# Patient Record
Sex: Male | Born: 1945 | Race: White | Hispanic: No | Marital: Married | Smoking: Never smoker
Health system: Southern US, Community
[De-identification: ages and names within clinical notes are randomized; demographics above are authoritative.]

## PROBLEM LIST (undated history)

## (undated) DIAGNOSIS — K274 Chronic or unspecified peptic ulcer, site unspecified, with hemorrhage: Secondary | ICD-10-CM

## (undated) DIAGNOSIS — K284 Chronic or unspecified gastrojejunal ulcer with hemorrhage: Secondary | ICD-10-CM

## (undated) HISTORY — PX: ESOPHAGOGASTRODUODENOSCOPY: SHX1529

## (undated) HISTORY — DX: Chronic or unspecified peptic ulcer, site unspecified, with hemorrhage: K27.4

## (undated) HISTORY — DX: Chronic or unspecified gastrojejunal ulcer with hemorrhage: K28.4

## (undated) HISTORY — PX: COLONOSCOPY: SHX174

---

## 2005-09-25 ENCOUNTER — Ambulatory Visit: Payer: Self-pay | Admitting: Family Medicine

## 2005-10-02 ENCOUNTER — Ambulatory Visit: Payer: Self-pay | Admitting: Family Medicine

## 2005-12-28 ENCOUNTER — Ambulatory Visit: Payer: Self-pay | Admitting: Family Medicine

## 2005-12-28 ENCOUNTER — Encounter: Admission: RE | Admit: 2005-12-28 | Discharge: 2005-12-28 | Payer: Self-pay | Admitting: Family Medicine

## 2006-01-08 ENCOUNTER — Ambulatory Visit: Payer: Self-pay | Admitting: Family Medicine

## 2006-08-26 ENCOUNTER — Ambulatory Visit: Payer: Self-pay | Admitting: Family Medicine

## 2006-09-09 ENCOUNTER — Ambulatory Visit: Payer: Self-pay | Admitting: Family Medicine

## 2006-10-07 ENCOUNTER — Ambulatory Visit: Payer: Self-pay | Admitting: Family Medicine

## 2006-10-07 LAB — CONVERTED CEMR LAB
Alkaline Phosphatase: 82 units/L (ref 39–117)
BUN: 8 mg/dL (ref 6–23)
Basophils Absolute: 0.1 10*3/uL (ref 0.0–0.1)
Basophils Relative: 0.8 % (ref 0.0–1.0)
Bilirubin, Direct: 0.1 mg/dL (ref 0.0–0.3)
Calcium: 8.8 mg/dL (ref 8.4–10.5)
Cholesterol: 178 mg/dL (ref 0–200)
Eosinophils Absolute: 0.1 10*3/uL (ref 0.0–0.6)
HCT: 47 % (ref 39.0–52.0)
HDL: 37.2 mg/dL — ABNORMAL LOW (ref >39.0)
Hemoglobin: 16.3 g/dL (ref 13.0–17.0)
Lymphocytes Relative: 24.7 % (ref 12.0–46.0)
MCHC: 34.7 g/dL (ref 30.0–36.0)
Neutrophils Relative %: 63 % (ref 43.0–77.0)
RBC: 5.06 M/uL (ref 4.22–5.81)
RDW: 12.3 % (ref 11.5–14.6)
Total CHOL/HDL Ratio: 4.8
Triglycerides: 171 mg/dL — ABNORMAL HIGH (ref 0–149)
WBC: 7.2 10*3/uL (ref 4.5–10.5)

## 2006-10-15 ENCOUNTER — Ambulatory Visit: Payer: Self-pay | Admitting: Family Medicine

## 2008-11-09 ENCOUNTER — Ambulatory Visit: Payer: Self-pay | Admitting: Family Medicine

## 2008-11-09 LAB — CONVERTED CEMR LAB
Bilirubin Urine: NEGATIVE
Ketones, urine, test strip: NEGATIVE
Nitrite: NEGATIVE
Specific Gravity, Urine: 1.02
WBC Urine, dipstick: NEGATIVE

## 2008-11-16 ENCOUNTER — Ambulatory Visit: Payer: Self-pay | Admitting: Family Medicine

## 2008-11-16 DIAGNOSIS — M129 Arthropathy, unspecified: Secondary | ICD-10-CM | POA: Insufficient documentation

## 2008-11-16 DIAGNOSIS — J309 Allergic rhinitis, unspecified: Secondary | ICD-10-CM | POA: Insufficient documentation

## 2008-11-16 DIAGNOSIS — B351 Tinea unguium: Secondary | ICD-10-CM

## 2008-11-16 LAB — HM COLONOSCOPY

## 2008-11-23 LAB — CONVERTED CEMR LAB
BUN: 11 mg/dL (ref 6–23)
CO2: 31 meq/L (ref 19–32)
Calcium: 9 mg/dL (ref 8.4–10.5)
Chloride: 109 meq/L (ref 96–112)
Creatinine, Ser: 0.8 mg/dL (ref 0.4–1.5)
Eosinophils Absolute: 0.1 10*3/uL (ref 0.0–0.7)
Eosinophils Relative: 1.5 % (ref 0.0–5.0)
GFR calc non Af Amer: 103.19 mL/min (ref >60)
Glucose, Bld: 121 mg/dL — ABNORMAL HIGH (ref 70–99)
HDL: 33.9 mg/dL — ABNORMAL LOW (ref >39.00)
Hemoglobin: 17.6 g/dL — ABNORMAL HIGH (ref 13.0–17.0)
LDL Cholesterol: 130 mg/dL — ABNORMAL HIGH (ref 0–99)
Lymphs Abs: 1.5 10*3/uL (ref 0.7–4.0)
MCV: 93.5 fL (ref 78.0–100.0)
Monocytes Relative: 7.5 % (ref 3.0–12.0)
Potassium: 4.3 meq/L (ref 3.5–5.1)
RBC: 5.3 M/uL (ref 4.22–5.81)
RDW: 12.5 % (ref 11.5–14.6)
Sodium: 146 meq/L — ABNORMAL HIGH (ref 135–145)
TSH: 2.15 microintl units/mL (ref 0.35–5.50)
Total Bilirubin: 1 mg/dL (ref 0.3–1.2)
Total CHOL/HDL Ratio: 6

## 2008-12-15 ENCOUNTER — Ambulatory Visit: Payer: Self-pay | Admitting: Family Medicine

## 2010-01-24 ENCOUNTER — Ambulatory Visit: Payer: Self-pay | Admitting: Family Medicine

## 2010-01-24 DIAGNOSIS — T50995A Adverse effect of other drugs, medicaments and biological substances, initial encounter: Secondary | ICD-10-CM

## 2010-01-24 DIAGNOSIS — E559 Vitamin D deficiency, unspecified: Secondary | ICD-10-CM | POA: Insufficient documentation

## 2010-01-24 DIAGNOSIS — E785 Hyperlipidemia, unspecified: Secondary | ICD-10-CM | POA: Insufficient documentation

## 2010-01-24 DIAGNOSIS — E039 Hypothyroidism, unspecified: Secondary | ICD-10-CM | POA: Insufficient documentation

## 2010-01-24 DIAGNOSIS — R350 Frequency of micturition: Secondary | ICD-10-CM

## 2010-01-24 DIAGNOSIS — D649 Anemia, unspecified: Secondary | ICD-10-CM | POA: Insufficient documentation

## 2010-07-29 LAB — CONVERTED CEMR LAB
ALT: 19 units/L (ref 0–53)
AST: 22 units/L (ref 0–37)
Albumin: 4.4 g/dL (ref 3.5–5.2)
Alkaline Phosphatase: 79 units/L (ref 39–117)
BUN: 12 mg/dL (ref 6–23)
Bilirubin, Direct: 0.2 mg/dL (ref 0.0–0.3)
CO2: 30 meq/L (ref 19–32)
Calcium: 9.1 mg/dL (ref 8.4–10.5)
Chloride: 102 meq/L (ref 96–112)
Cholesterol: 173 mg/dL (ref 0–200)
Creatinine, Ser: 0.8 mg/dL (ref 0.4–1.5)
Eosinophils Absolute: 0.1 10*3/uL (ref 0.0–0.7)
Eosinophils Relative: 1.1 % (ref 0.0–5.0)
GFR calc non Af Amer: 99.92 mL/min (ref >60)
Glucose, Bld: 103 mg/dL — ABNORMAL HIGH (ref 70–99)
HCT: 47.1 % (ref 39.0–52.0)
LDL Cholesterol: 109 mg/dL — ABNORMAL HIGH (ref 0–99)
Lymphocytes Relative: 18.3 % (ref 12.0–46.0)
Lymphs Abs: 1.7 10*3/uL (ref 0.7–4.0)
MCV: 94.8 fL (ref 78.0–100.0)
Nitrite: NEGATIVE
Platelets: 213 10*3/uL (ref 150.0–400.0)
Potassium: 5.2 meq/L — ABNORMAL HIGH (ref 3.5–5.1)
RBC: 4.97 M/uL (ref 4.22–5.81)
Total CHOL/HDL Ratio: 5
Total Protein: 7 g/dL (ref 6.0–8.3)
Triglycerides: 132 mg/dL (ref 0.0–149.0)
Vit D, 25-Hydroxy: 32 ng/mL (ref 30–89)
WBC: 9.5 10*3/uL (ref 4.5–10.5)
pH: 7

## 2010-08-02 NOTE — Assessment & Plan Note (Signed)
Summary: pt will come in fasting/njr   Vital Signs:  Patient profile:   65 year old male Height:      69 inches Weight:      168 pounds BMI:     24.90 O2 Sat:      97 % Temp:     97.8 degrees F Pulse rate:   102 / minute Pulse rhythm:   regular BP sitting:   130 / 82  (left arm)  Vitals Entered By: Pura Spice, RN (January 24, 2010 10:43 AM) CC: go over problems fasting for labs Is Patient Diabetic? 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 Jonne Ply  Past History:  Past Medical History: Silent Bleeding Ulcer in 1980s no  ASA or Nsaids  Review of Systems      See HPI  The patient denies anorexia, fever, weight loss, weight gain, vision loss, decreased hearing, hoarseness, chest pain, syncope, dyspnea on exertion, peripheral edema, prolonged cough, headaches, hemoptysis, abdominal pain, melena, hematochezia, severe indigestion/heartburn, hematuria, incontinence, genital sores, muscle weakness, suspicious skin lesions, transient blindness, difficulty walking, depression, unusual weight change, abnormal bleeding, enlarged lymph nodes, angioedema, breast masses, and testicular masses.    Physical Exam  General:  Well-developed,well-nourished,in no acute distress; alert,appropriate and cooperative throughout examinationballd  Head:  Normocephalic and atraumatic without obvious abnormalities. No apparent alopecia or balding. Eyes:  No corneal or conjunctival inflammation noted. EOMI.  Perrla. Funduscopic exam benign, without hemorrhages, exudates or papilledema. Vision grossly normal. Ears:  External ear exam shows no significant lesions or deformities.  Otoscopic examination reveals clear canals, tympanic membranes are intact bilaterally without bulging, retraction, inflammation or discharge. Hearing is grossly normal bilaterally. Nose:  External nasal examination shows no deformity or inflammation. Nasal mucosa are pink and moist without lesions or exudates. Mouth:  Oral mucosa and oropharynx without lesions or exudates.  Teeth in good repair. Neck:  No deformities, masses, or tenderness noted. Chest Wall:  No deformities, masses, tenderness or gynecomastia noted. Breasts:  No masses or gynecomastia noted Lungs:  Normal respiratory effort, chest expands symmetrically. Lungs are clear to auscultation, no crackles or wheezes. Heart:  Normal rate and regular rhythm. S1 and S2 normal without gallop, murmur, click, rub or other extra sounds. cardiogram revealed no ischemia nor abnormal rhythm Abdomen:  Bowel sounds positive,abdomen soft and non-tender without masses, organomegaly or hernias noted. Rectal:  No external abnormalities noted. Normal sphincter tone. No rectal masses or tenderness. Genitalia:  Testes bilaterally descended without nodularity, tenderness or masses. No scrotal masses or lesions. No penis lesions or urethral discharge. Prostate:  Prostate gland firm and smooth, no enlargement, nodularity, tenderness, mass, asymmetry or induration. Msk:  No deformity or scoliosis noted of thoracic or lumbar spine.   Pulses:  R and L carotid,radial,femoral,dorsalis pedis and posterior tibial pulses are full and equal bilaterally Extremities:  No clubbing, cyanosis, edema, or deformity noted with normal full range of motion of all joints.   Neurologic:  No cranial nerve  deficits noted. Station and gait are normal. Plantar reflexes are down-going bilaterally. DTRs are symmetrical  throughout. Sensory, motor and coordinative functions appear intact. Skin:  Intact without suspicious lesions or rashes Cervical Nodes:  No lymphadenopathy noted Axillary Nodes:  No palpable lymphadenopathy Inguinal Nodes:  No significant adenopathy Psych:  Cognition and judgment appear intact. Alert and cooperative with normal attention span and concentration. No apparent delusions, illusions, hallucinations   Impression & Recommendations:  Problem # 1:  PHYSICAL EXAMINATION (ICD-V70.0) Assessment Unchanged first severe physical for Medicare  Problem # 2:  ONYCHOMYCOSIS, TOENAILS (ICD-110.1) Assessment: Unchanged  The following medications were removed from the medication list:    Lamisil 250 Mg Tabs (Terbinafine hcl) .Marland Kitchen... 1 once daily for 3 months  Complete Medication List: 1)  Timolol Maleate 0.25 % Soln (Timolol maleate) .... Pt thinks this is it 2)  Travatan 0.004 % Soln (Travoprost) .... Sees dr thurmond 3)  Zovirax 5 % Crea (Acyclovir) .... Apply qid as needed  Other Orders: Venipuncture (40981) T-Vitamin D (25-Hydroxy) [phone]) Urinalysis-dipstick only (Medicare patient) (21308MV) EKG w/ Interpretation (93000) Specimen Handling (78469) TLB-Lipid Panel (80061-LIPID) TLB-BMP (Basic Metabolic Panel-BMET) (80048-METABOL) TLB-CBC Platelet - w/Differential (85025-CBCD) TLB-Hepatic/Liver Function Pnl (80076-HEPATIC) TLB-TSH (Thyroid Stimulating Hormone) (84443-TSH) TLB-PSA (Prostate Specific Antigen) (84153-PSA)  Patient Instructions: 1)  physical examination reveals a healthy male who is lost in balance since his physical one year ago 2)  I do not find any new problems or abnormality 3)  Continue regular walking as you've been doing in the past 4)  We'll call lab results Prescriptions: ZOVIRAX 5 % CREA (ACYCLOVIR) apply qid as needed  #5gms x 11   Entered and Authorized by:   Judithann Sheen MD   Signed by:   Judithann Sheen MD on 01/24/2010   Method  used:   Electronically to        [facility] ([facility]       [address]       [place], [place]  [address]       Ph: [phone] or [phone]       Fax: [phone]   RxID:   [id number]    Eye Exam  Dr Santiago Bumpers 2011     Laboratory Results   Urine Tests  Date/Time Recieved: January 24, 2010 12:30 PM  Date/Time Reported: January 24, 2010 12:30 PM   Routine Urinalysis   Color: yellow Appearance: Clear Glucose: negative   (Normal Range: Negative) Bilirubin: negative   (Normal Range: Negative) Ketone: negative   (Normal Range: Negative) Spec. Gravity: 1.025   (Normal Range: 1.003-1.035) Blood: negative   (Normal Range: Negative) pH: 7.0   (Normal Range: 5.0-8.0) Protein: trace   (Normal Range: Negative) Urobilinogen: 0.2   (Normal Range: 0-1) Nitrite: negative   (Normal Range: Negative) Leukocyte Esterace: negative   (Normal Range: Negative)    Comments: Wynona Canes, CMA  January 24, 2010 12:30 PM

## 2011-02-14 ENCOUNTER — Ambulatory Visit (INDEPENDENT_AMBULATORY_CARE_PROVIDER_SITE_OTHER): Payer: Medicare Other | Admitting: Family Medicine

## 2011-02-14 ENCOUNTER — Encounter: Payer: Self-pay | Admitting: Family Medicine

## 2011-02-14 VITALS — BP 110/78 | HR 100 | Temp 98.0°F | Resp 16 | Ht 69.5 in | Wt 163.0 lb

## 2011-02-14 DIAGNOSIS — R351 Nocturia: Secondary | ICD-10-CM

## 2011-02-14 DIAGNOSIS — E559 Vitamin D deficiency, unspecified: Secondary | ICD-10-CM

## 2011-02-14 DIAGNOSIS — N139 Obstructive and reflux uropathy, unspecified: Secondary | ICD-10-CM

## 2011-02-14 DIAGNOSIS — M1990 Unspecified osteoarthritis, unspecified site: Secondary | ICD-10-CM

## 2011-02-14 DIAGNOSIS — N401 Enlarged prostate with lower urinary tract symptoms: Secondary | ICD-10-CM

## 2011-02-14 DIAGNOSIS — Z136 Encounter for screening for cardiovascular disorders: Secondary | ICD-10-CM

## 2011-02-14 DIAGNOSIS — E039 Hypothyroidism, unspecified: Secondary | ICD-10-CM

## 2011-02-14 DIAGNOSIS — E785 Hyperlipidemia, unspecified: Secondary | ICD-10-CM

## 2011-02-14 DIAGNOSIS — H409 Unspecified glaucoma: Secondary | ICD-10-CM

## 2011-02-14 DIAGNOSIS — D649 Anemia, unspecified: Secondary | ICD-10-CM

## 2011-02-14 DIAGNOSIS — M129 Arthropathy, unspecified: Secondary | ICD-10-CM

## 2011-02-14 LAB — HEPATIC FUNCTION PANEL
ALT: 19 U/L (ref 0–53)
Total Protein: 6.9 g/dL (ref 6.0–8.3)

## 2011-02-14 LAB — CBC WITH DIFFERENTIAL/PLATELET
Basophils Absolute: 0 10*3/uL (ref 0.0–0.1)
Eosinophils Absolute: 0.1 10*3/uL (ref 0.0–0.7)
Hemoglobin: 16.9 g/dL (ref 13.0–17.0)
Lymphocytes Relative: 18.5 % (ref 12.0–46.0)
MCHC: 33.9 g/dL (ref 30.0–36.0)
MCV: 96.3 fl (ref 78.0–100.0)
Monocytes Absolute: 0.7 10*3/uL (ref 0.1–1.0)
Neutro Abs: 5.7 10*3/uL (ref 1.4–7.7)
Neutrophils Relative %: 71.4 % (ref 43.0–77.0)
RBC: 5.19 Mil/uL (ref 4.22–5.81)
RDW: 13.4 % (ref 11.5–14.6)
WBC: 8 10*3/uL (ref 4.5–10.5)

## 2011-02-14 LAB — POCT URINALYSIS DIPSTICK
Ketones, UA: NEGATIVE
Leukocytes, UA: NEGATIVE
Nitrite, UA: NEGATIVE

## 2011-02-14 LAB — LIPID PANEL
Cholesterol: 173 mg/dL (ref 0–200)
HDL: 45.1 mg/dL (ref >39.00)
Total CHOL/HDL Ratio: 4
Triglycerides: 118 mg/dL (ref 0.0–149.0)

## 2011-02-14 LAB — BASIC METABOLIC PANEL
Chloride: 99 mEq/L (ref 96–112)
Sodium: 137 mEq/L (ref 135–145)

## 2011-02-14 MED ORDER — PREDNISONE 10 MG PO TABS: ORAL_TABLET | ORAL | Status: DC

## 2011-02-14 NOTE — Patient Instructions (Signed)
You are doing very well,  EKG only abnormality is 2 ectopic beats Arthritis toes rt foot, take short course prednisone, if goes away but then reoccurance repeat tratment

## 2011-02-14 NOTE — Progress Notes (Signed)
  Subjective:    Patient ID: Derrick Harris, male    DOB: [birth year], 65 y.o.   MRN: [id number] This 65 year old white married male is in to discuss his medical which are few but has slowed to of pain in the right foot or toes which is rather severe time but of shorter duration. He also relates and occasionally has a unexpected epistaxis . Under care of ophthalmologist for glaucoma. System review reveals no new symptoms. Has past history years ago of asymptomatic GI bleeding and has been recommended not to take aspirin or NSAIDs He relates he has urinary frequency during the day not. 2-,3 times a night no dysuria no change in flow HPI    Review of Systems  Constitutional: Negative.   HENT: Positive for nosebleeds.   Eyes:       Glaucoma control  Respiratory: Negative.   Cardiovascular: Negative.   Gastrointestinal: Negative.   Genitourinary:       Increased urinary frequency 2-3 hours during the day nocturia 2-3 times a night  Musculoskeletal: Negative.   Neurological: Positive for facial asymmetry.  Hematological: Bruises/bleeds easily.       Objective:   Physical Exam the patient is a well-built well-nourished white male who appears healthy and in no distress  HEENT no positive findings carotid pulses good thyroid is normal Lungs are clear to palpation percussion and auscultation no rales no dullness no wheezing Heart examination reveals no cardiomegaly heart sounds are good without murmurs regular rhythm peripheral pulses are good and equal bilaterally EKG normal except for 2 ectopic beats Abdomen liver spleen kidneys are nonpalpable no masses no tenderness normal bowel sounds Genitalia normal Rectal exam normal prostate flat no tenderness no nodules normal size Extremities all the positive findings consist of tenderness MP joints of the second third fourth toes right foot Skin small area of eczema left side resolving Spine negative exam Neurological no positive findings reflexes  normal equal bilaterally 2+        Assessment & Plan:  Healthy male blood pressure  Normal Arthritis toes right foot treat with short course prednisone 10 mg Unexplained epistaxis if persists or referred to ENT Colonoscopic exam due in 2014

## 2011-02-18 NOTE — Progress Notes (Signed)
Quick Note:  Pt aware ______ 

## 2011-07-09 DIAGNOSIS — H409 Unspecified glaucoma: Secondary | ICD-10-CM | POA: Diagnosis not present

## 2011-07-09 DIAGNOSIS — H4011X0 Primary open-angle glaucoma, stage unspecified: Secondary | ICD-10-CM | POA: Diagnosis not present

## 2011-08-14 DIAGNOSIS — H409 Unspecified glaucoma: Secondary | ICD-10-CM | POA: Diagnosis not present

## 2011-08-14 DIAGNOSIS — H4011X0 Primary open-angle glaucoma, stage unspecified: Secondary | ICD-10-CM | POA: Diagnosis not present

## 2011-12-16 DIAGNOSIS — H4011X0 Primary open-angle glaucoma, stage unspecified: Secondary | ICD-10-CM | POA: Diagnosis not present

## 2011-12-16 DIAGNOSIS — H409 Unspecified glaucoma: Secondary | ICD-10-CM | POA: Diagnosis not present

## 2012-02-05 DIAGNOSIS — H2510 Age-related nuclear cataract, unspecified eye: Secondary | ICD-10-CM | POA: Diagnosis not present

## 2012-02-05 DIAGNOSIS — H524 Presbyopia: Secondary | ICD-10-CM | POA: Diagnosis not present

## 2012-02-05 DIAGNOSIS — H409 Unspecified glaucoma: Secondary | ICD-10-CM | POA: Diagnosis not present

## 2012-02-05 DIAGNOSIS — H4011X0 Primary open-angle glaucoma, stage unspecified: Secondary | ICD-10-CM | POA: Diagnosis not present

## 2012-06-10 DIAGNOSIS — H4011X0 Primary open-angle glaucoma, stage unspecified: Secondary | ICD-10-CM | POA: Diagnosis not present

## 2012-06-11 DIAGNOSIS — R350 Frequency of micturition: Secondary | ICD-10-CM | POA: Diagnosis not present

## 2012-06-11 DIAGNOSIS — I10 Essential (primary) hypertension: Secondary | ICD-10-CM | POA: Diagnosis not present

## 2012-06-11 DIAGNOSIS — Z125 Encounter for screening for malignant neoplasm of prostate: Secondary | ICD-10-CM | POA: Diagnosis not present

## 2012-07-22 DIAGNOSIS — Z23 Encounter for immunization: Secondary | ICD-10-CM | POA: Diagnosis not present

## 2012-07-22 DIAGNOSIS — N400 Enlarged prostate without lower urinary tract symptoms: Secondary | ICD-10-CM | POA: Diagnosis not present

## 2012-07-22 DIAGNOSIS — Z1211 Encounter for screening for malignant neoplasm of colon: Secondary | ICD-10-CM | POA: Diagnosis not present

## 2012-07-22 DIAGNOSIS — E782 Mixed hyperlipidemia: Secondary | ICD-10-CM | POA: Diagnosis not present

## 2012-07-22 DIAGNOSIS — I10 Essential (primary) hypertension: Secondary | ICD-10-CM | POA: Diagnosis not present

## 2012-07-22 DIAGNOSIS — Z0000 Encounter for general adult medical examination without abnormal findings: Secondary | ICD-10-CM | POA: Diagnosis not present

## 2012-09-03 DIAGNOSIS — Z0000 Encounter for general adult medical examination without abnormal findings: Secondary | ICD-10-CM | POA: Diagnosis not present

## 2012-10-14 DIAGNOSIS — H4011X0 Primary open-angle glaucoma, stage unspecified: Secondary | ICD-10-CM | POA: Diagnosis not present

## 2012-11-03 DIAGNOSIS — K625 Hemorrhage of anus and rectum: Secondary | ICD-10-CM | POA: Diagnosis not present

## 2012-11-03 DIAGNOSIS — R197 Diarrhea, unspecified: Secondary | ICD-10-CM | POA: Diagnosis not present

## 2012-11-03 DIAGNOSIS — Z1211 Encounter for screening for malignant neoplasm of colon: Secondary | ICD-10-CM | POA: Diagnosis not present

## 2013-01-19 DIAGNOSIS — E782 Mixed hyperlipidemia: Secondary | ICD-10-CM | POA: Diagnosis not present

## 2013-01-19 DIAGNOSIS — R29818 Other symptoms and signs involving the nervous system: Secondary | ICD-10-CM | POA: Diagnosis not present

## 2013-01-19 DIAGNOSIS — R350 Frequency of micturition: Secondary | ICD-10-CM | POA: Diagnosis not present

## 2013-01-19 DIAGNOSIS — N400 Enlarged prostate without lower urinary tract symptoms: Secondary | ICD-10-CM | POA: Diagnosis not present

## 2013-01-19 DIAGNOSIS — I10 Essential (primary) hypertension: Secondary | ICD-10-CM | POA: Diagnosis not present

## 2013-02-10 DIAGNOSIS — H4011X0 Primary open-angle glaucoma, stage unspecified: Secondary | ICD-10-CM | POA: Diagnosis not present

## 2013-03-19 DIAGNOSIS — R351 Nocturia: Secondary | ICD-10-CM | POA: Diagnosis not present

## 2013-03-19 DIAGNOSIS — R350 Frequency of micturition: Secondary | ICD-10-CM | POA: Diagnosis not present

## 2013-06-11 ENCOUNTER — Encounter: Payer: Self-pay | Admitting: Gastroenterology

## 2013-06-16 DIAGNOSIS — H2510 Age-related nuclear cataract, unspecified eye: Secondary | ICD-10-CM | POA: Diagnosis not present

## 2013-06-16 DIAGNOSIS — H4011X0 Primary open-angle glaucoma, stage unspecified: Secondary | ICD-10-CM | POA: Diagnosis not present

## 2013-07-12 DIAGNOSIS — K625 Hemorrhage of anus and rectum: Secondary | ICD-10-CM | POA: Diagnosis not present

## 2013-07-12 DIAGNOSIS — Z1211 Encounter for screening for malignant neoplasm of colon: Secondary | ICD-10-CM | POA: Diagnosis not present

## 2013-07-12 DIAGNOSIS — R197 Diarrhea, unspecified: Secondary | ICD-10-CM | POA: Diagnosis not present

## 2013-07-12 DIAGNOSIS — D126 Benign neoplasm of colon, unspecified: Secondary | ICD-10-CM | POA: Diagnosis not present

## 2013-07-28 DIAGNOSIS — E782 Mixed hyperlipidemia: Secondary | ICD-10-CM | POA: Diagnosis not present

## 2013-07-28 DIAGNOSIS — R350 Frequency of micturition: Secondary | ICD-10-CM | POA: Diagnosis not present

## 2013-07-28 DIAGNOSIS — N400 Enlarged prostate without lower urinary tract symptoms: Secondary | ICD-10-CM | POA: Diagnosis not present

## 2013-07-28 DIAGNOSIS — Z23 Encounter for immunization: Secondary | ICD-10-CM | POA: Diagnosis not present

## 2013-07-28 DIAGNOSIS — H409 Unspecified glaucoma: Secondary | ICD-10-CM | POA: Diagnosis not present

## 2013-07-28 DIAGNOSIS — Z0000 Encounter for general adult medical examination without abnormal findings: Secondary | ICD-10-CM | POA: Diagnosis not present

## 2013-07-28 DIAGNOSIS — I10 Essential (primary) hypertension: Secondary | ICD-10-CM | POA: Diagnosis not present

## 2013-07-28 DIAGNOSIS — B351 Tinea unguium: Secondary | ICD-10-CM | POA: Diagnosis not present

## 2013-07-28 DIAGNOSIS — Z1331 Encounter for screening for depression: Secondary | ICD-10-CM | POA: Diagnosis not present

## 2013-10-25 DIAGNOSIS — H4011X0 Primary open-angle glaucoma, stage unspecified: Secondary | ICD-10-CM | POA: Diagnosis not present

## 2014-01-13 ENCOUNTER — Encounter: Payer: Self-pay | Admitting: Gastroenterology

## 2014-01-27 DIAGNOSIS — L259 Unspecified contact dermatitis, unspecified cause: Secondary | ICD-10-CM | POA: Diagnosis not present

## 2014-01-27 DIAGNOSIS — M5126 Other intervertebral disc displacement, lumbar region: Secondary | ICD-10-CM | POA: Diagnosis not present

## 2014-01-27 DIAGNOSIS — R350 Frequency of micturition: Secondary | ICD-10-CM | POA: Diagnosis not present

## 2014-01-27 DIAGNOSIS — I10 Essential (primary) hypertension: Secondary | ICD-10-CM | POA: Diagnosis not present

## 2014-01-27 DIAGNOSIS — M5124 Other intervertebral disc displacement, thoracic region: Secondary | ICD-10-CM | POA: Diagnosis not present

## 2014-01-27 DIAGNOSIS — M545 Low back pain, unspecified: Secondary | ICD-10-CM | POA: Diagnosis not present

## 2014-01-27 DIAGNOSIS — B351 Tinea unguium: Secondary | ICD-10-CM | POA: Diagnosis not present

## 2014-01-27 DIAGNOSIS — IMO0002 Reserved for concepts with insufficient information to code with codable children: Secondary | ICD-10-CM | POA: Diagnosis not present

## 2014-01-27 DIAGNOSIS — E782 Mixed hyperlipidemia: Secondary | ICD-10-CM | POA: Diagnosis not present

## 2014-01-27 DIAGNOSIS — M999 Biomechanical lesion, unspecified: Secondary | ICD-10-CM | POA: Diagnosis not present

## 2014-01-28 DIAGNOSIS — M5126 Other intervertebral disc displacement, lumbar region: Secondary | ICD-10-CM | POA: Diagnosis not present

## 2014-01-28 DIAGNOSIS — IMO0002 Reserved for concepts with insufficient information to code with codable children: Secondary | ICD-10-CM | POA: Diagnosis not present

## 2014-01-28 DIAGNOSIS — M5124 Other intervertebral disc displacement, thoracic region: Secondary | ICD-10-CM | POA: Diagnosis not present

## 2014-01-28 DIAGNOSIS — M999 Biomechanical lesion, unspecified: Secondary | ICD-10-CM | POA: Diagnosis not present

## 2014-01-31 DIAGNOSIS — M5126 Other intervertebral disc displacement, lumbar region: Secondary | ICD-10-CM | POA: Diagnosis not present

## 2014-01-31 DIAGNOSIS — M5124 Other intervertebral disc displacement, thoracic region: Secondary | ICD-10-CM | POA: Diagnosis not present

## 2014-01-31 DIAGNOSIS — M999 Biomechanical lesion, unspecified: Secondary | ICD-10-CM | POA: Diagnosis not present

## 2014-01-31 DIAGNOSIS — IMO0002 Reserved for concepts with insufficient information to code with codable children: Secondary | ICD-10-CM | POA: Diagnosis not present

## 2014-02-02 DIAGNOSIS — M999 Biomechanical lesion, unspecified: Secondary | ICD-10-CM | POA: Diagnosis not present

## 2014-02-02 DIAGNOSIS — IMO0002 Reserved for concepts with insufficient information to code with codable children: Secondary | ICD-10-CM | POA: Diagnosis not present

## 2014-02-02 DIAGNOSIS — M5126 Other intervertebral disc displacement, lumbar region: Secondary | ICD-10-CM | POA: Diagnosis not present

## 2014-02-02 DIAGNOSIS — M5124 Other intervertebral disc displacement, thoracic region: Secondary | ICD-10-CM | POA: Diagnosis not present

## 2014-02-04 DIAGNOSIS — IMO0002 Reserved for concepts with insufficient information to code with codable children: Secondary | ICD-10-CM | POA: Diagnosis not present

## 2014-02-04 DIAGNOSIS — M999 Biomechanical lesion, unspecified: Secondary | ICD-10-CM | POA: Diagnosis not present

## 2014-02-04 DIAGNOSIS — M5126 Other intervertebral disc displacement, lumbar region: Secondary | ICD-10-CM | POA: Diagnosis not present

## 2014-02-04 DIAGNOSIS — M5124 Other intervertebral disc displacement, thoracic region: Secondary | ICD-10-CM | POA: Diagnosis not present

## 2014-02-07 DIAGNOSIS — M5124 Other intervertebral disc displacement, thoracic region: Secondary | ICD-10-CM | POA: Diagnosis not present

## 2014-02-07 DIAGNOSIS — IMO0002 Reserved for concepts with insufficient information to code with codable children: Secondary | ICD-10-CM | POA: Diagnosis not present

## 2014-02-07 DIAGNOSIS — M999 Biomechanical lesion, unspecified: Secondary | ICD-10-CM | POA: Diagnosis not present

## 2014-02-07 DIAGNOSIS — M5126 Other intervertebral disc displacement, lumbar region: Secondary | ICD-10-CM | POA: Diagnosis not present

## 2014-02-15 DIAGNOSIS — M5124 Other intervertebral disc displacement, thoracic region: Secondary | ICD-10-CM | POA: Diagnosis not present

## 2014-02-15 DIAGNOSIS — M5126 Other intervertebral disc displacement, lumbar region: Secondary | ICD-10-CM | POA: Diagnosis not present

## 2014-02-15 DIAGNOSIS — M999 Biomechanical lesion, unspecified: Secondary | ICD-10-CM | POA: Diagnosis not present

## 2014-02-15 DIAGNOSIS — IMO0002 Reserved for concepts with insufficient information to code with codable children: Secondary | ICD-10-CM | POA: Diagnosis not present

## 2014-02-17 DIAGNOSIS — M5124 Other intervertebral disc displacement, thoracic region: Secondary | ICD-10-CM | POA: Diagnosis not present

## 2014-02-17 DIAGNOSIS — M999 Biomechanical lesion, unspecified: Secondary | ICD-10-CM | POA: Diagnosis not present

## 2014-02-17 DIAGNOSIS — IMO0002 Reserved for concepts with insufficient information to code with codable children: Secondary | ICD-10-CM | POA: Diagnosis not present

## 2014-02-17 DIAGNOSIS — M5126 Other intervertebral disc displacement, lumbar region: Secondary | ICD-10-CM | POA: Diagnosis not present

## 2014-02-21 DIAGNOSIS — M5126 Other intervertebral disc displacement, lumbar region: Secondary | ICD-10-CM | POA: Diagnosis not present

## 2014-02-21 DIAGNOSIS — M5124 Other intervertebral disc displacement, thoracic region: Secondary | ICD-10-CM | POA: Diagnosis not present

## 2014-02-21 DIAGNOSIS — M999 Biomechanical lesion, unspecified: Secondary | ICD-10-CM | POA: Diagnosis not present

## 2014-02-21 DIAGNOSIS — IMO0002 Reserved for concepts with insufficient information to code with codable children: Secondary | ICD-10-CM | POA: Diagnosis not present

## 2014-02-24 DIAGNOSIS — IMO0002 Reserved for concepts with insufficient information to code with codable children: Secondary | ICD-10-CM | POA: Diagnosis not present

## 2014-02-24 DIAGNOSIS — M999 Biomechanical lesion, unspecified: Secondary | ICD-10-CM | POA: Diagnosis not present

## 2014-02-24 DIAGNOSIS — M5126 Other intervertebral disc displacement, lumbar region: Secondary | ICD-10-CM | POA: Diagnosis not present

## 2014-02-24 DIAGNOSIS — M5124 Other intervertebral disc displacement, thoracic region: Secondary | ICD-10-CM | POA: Diagnosis not present

## 2014-02-28 DIAGNOSIS — IMO0002 Reserved for concepts with insufficient information to code with codable children: Secondary | ICD-10-CM | POA: Diagnosis not present

## 2014-02-28 DIAGNOSIS — M999 Biomechanical lesion, unspecified: Secondary | ICD-10-CM | POA: Diagnosis not present

## 2014-02-28 DIAGNOSIS — M5124 Other intervertebral disc displacement, thoracic region: Secondary | ICD-10-CM | POA: Diagnosis not present

## 2014-02-28 DIAGNOSIS — M5126 Other intervertebral disc displacement, lumbar region: Secondary | ICD-10-CM | POA: Diagnosis not present

## 2014-02-28 DIAGNOSIS — H4011X0 Primary open-angle glaucoma, stage unspecified: Secondary | ICD-10-CM | POA: Diagnosis not present

## 2014-03-03 DIAGNOSIS — M999 Biomechanical lesion, unspecified: Secondary | ICD-10-CM | POA: Diagnosis not present

## 2014-03-03 DIAGNOSIS — IMO0002 Reserved for concepts with insufficient information to code with codable children: Secondary | ICD-10-CM | POA: Diagnosis not present

## 2014-03-03 DIAGNOSIS — M5124 Other intervertebral disc displacement, thoracic region: Secondary | ICD-10-CM | POA: Diagnosis not present

## 2014-03-03 DIAGNOSIS — M5126 Other intervertebral disc displacement, lumbar region: Secondary | ICD-10-CM | POA: Diagnosis not present

## 2014-04-15 DIAGNOSIS — M5127 Other intervertebral disc displacement, lumbosacral region: Secondary | ICD-10-CM | POA: Diagnosis not present

## 2014-04-15 DIAGNOSIS — M5417 Radiculopathy, lumbosacral region: Secondary | ICD-10-CM | POA: Diagnosis not present

## 2014-04-15 DIAGNOSIS — M9902 Segmental and somatic dysfunction of thoracic region: Secondary | ICD-10-CM | POA: Diagnosis not present

## 2014-04-15 DIAGNOSIS — M5125 Other intervertebral disc displacement, thoracolumbar region: Secondary | ICD-10-CM | POA: Diagnosis not present

## 2014-04-15 DIAGNOSIS — M9903 Segmental and somatic dysfunction of lumbar region: Secondary | ICD-10-CM | POA: Diagnosis not present

## 2014-04-26 DIAGNOSIS — M542 Cervicalgia: Secondary | ICD-10-CM | POA: Diagnosis not present

## 2014-04-26 DIAGNOSIS — M545 Low back pain: Secondary | ICD-10-CM | POA: Diagnosis not present

## 2014-05-05 DIAGNOSIS — M542 Cervicalgia: Secondary | ICD-10-CM | POA: Diagnosis not present

## 2014-05-05 DIAGNOSIS — M545 Low back pain: Secondary | ICD-10-CM | POA: Diagnosis not present

## 2014-05-09 DIAGNOSIS — M542 Cervicalgia: Secondary | ICD-10-CM | POA: Diagnosis not present

## 2014-05-09 DIAGNOSIS — M545 Low back pain: Secondary | ICD-10-CM | POA: Diagnosis not present

## 2014-05-12 DIAGNOSIS — M545 Low back pain: Secondary | ICD-10-CM | POA: Diagnosis not present

## 2014-05-12 DIAGNOSIS — M542 Cervicalgia: Secondary | ICD-10-CM | POA: Diagnosis not present

## 2014-05-17 DIAGNOSIS — M542 Cervicalgia: Secondary | ICD-10-CM | POA: Diagnosis not present

## 2014-05-17 DIAGNOSIS — M545 Low back pain: Secondary | ICD-10-CM | POA: Diagnosis not present

## 2014-05-19 DIAGNOSIS — M542 Cervicalgia: Secondary | ICD-10-CM | POA: Diagnosis not present

## 2014-05-19 DIAGNOSIS — M545 Low back pain: Secondary | ICD-10-CM | POA: Diagnosis not present

## 2014-06-02 DIAGNOSIS — M542 Cervicalgia: Secondary | ICD-10-CM | POA: Diagnosis not present

## 2014-06-02 DIAGNOSIS — M545 Low back pain: Secondary | ICD-10-CM | POA: Diagnosis not present

## 2014-06-06 DIAGNOSIS — M545 Low back pain: Secondary | ICD-10-CM | POA: Diagnosis not present

## 2014-06-06 DIAGNOSIS — M542 Cervicalgia: Secondary | ICD-10-CM | POA: Diagnosis not present

## 2014-06-09 DIAGNOSIS — M545 Low back pain: Secondary | ICD-10-CM | POA: Diagnosis not present

## 2014-06-09 DIAGNOSIS — M542 Cervicalgia: Secondary | ICD-10-CM | POA: Diagnosis not present

## 2014-06-14 DIAGNOSIS — M542 Cervicalgia: Secondary | ICD-10-CM | POA: Diagnosis not present

## 2014-06-14 DIAGNOSIS — M545 Low back pain: Secondary | ICD-10-CM | POA: Diagnosis not present

## 2014-07-05 DIAGNOSIS — H4011X2 Primary open-angle glaucoma, moderate stage: Secondary | ICD-10-CM | POA: Diagnosis not present

## 2014-07-05 DIAGNOSIS — H2513 Age-related nuclear cataract, bilateral: Secondary | ICD-10-CM | POA: Diagnosis not present

## 2014-08-10 DIAGNOSIS — M542 Cervicalgia: Secondary | ICD-10-CM | POA: Diagnosis not present

## 2014-08-10 DIAGNOSIS — H409 Unspecified glaucoma: Secondary | ICD-10-CM | POA: Diagnosis not present

## 2014-08-10 DIAGNOSIS — E782 Mixed hyperlipidemia: Secondary | ICD-10-CM | POA: Diagnosis not present

## 2014-08-10 DIAGNOSIS — L989 Disorder of the skin and subcutaneous tissue, unspecified: Secondary | ICD-10-CM | POA: Diagnosis not present

## 2014-08-10 DIAGNOSIS — Z0000 Encounter for general adult medical examination without abnormal findings: Secondary | ICD-10-CM | POA: Diagnosis not present

## 2014-08-10 DIAGNOSIS — R350 Frequency of micturition: Secondary | ICD-10-CM | POA: Diagnosis not present

## 2014-08-10 DIAGNOSIS — Z1389 Encounter for screening for other disorder: Secondary | ICD-10-CM | POA: Diagnosis not present

## 2014-08-10 DIAGNOSIS — Z23 Encounter for immunization: Secondary | ICD-10-CM | POA: Diagnosis not present

## 2014-08-10 DIAGNOSIS — N400 Enlarged prostate without lower urinary tract symptoms: Secondary | ICD-10-CM | POA: Diagnosis not present

## 2014-08-10 DIAGNOSIS — Z8249 Family history of ischemic heart disease and other diseases of the circulatory system: Secondary | ICD-10-CM | POA: Diagnosis not present

## 2014-08-10 DIAGNOSIS — I10 Essential (primary) hypertension: Secondary | ICD-10-CM | POA: Diagnosis not present

## 2014-09-08 DIAGNOSIS — L280 Lichen simplex chronicus: Secondary | ICD-10-CM | POA: Diagnosis not present

## 2014-09-08 DIAGNOSIS — D044 Carcinoma in situ of skin of scalp and neck: Secondary | ICD-10-CM | POA: Diagnosis not present

## 2014-09-14 DIAGNOSIS — I10 Essential (primary) hypertension: Secondary | ICD-10-CM | POA: Diagnosis not present

## 2014-09-14 DIAGNOSIS — Z8249 Family history of ischemic heart disease and other diseases of the circulatory system: Secondary | ICD-10-CM | POA: Diagnosis not present

## 2014-09-14 DIAGNOSIS — E784 Other hyperlipidemia: Secondary | ICD-10-CM | POA: Diagnosis not present

## 2014-10-04 DIAGNOSIS — D044 Carcinoma in situ of skin of scalp and neck: Secondary | ICD-10-CM | POA: Diagnosis not present

## 2014-11-09 DIAGNOSIS — H4011X2 Primary open-angle glaucoma, moderate stage: Secondary | ICD-10-CM | POA: Diagnosis not present

## 2015-02-17 DIAGNOSIS — E782 Mixed hyperlipidemia: Secondary | ICD-10-CM | POA: Diagnosis not present

## 2015-02-17 DIAGNOSIS — I10 Essential (primary) hypertension: Secondary | ICD-10-CM | POA: Diagnosis not present

## 2015-02-17 DIAGNOSIS — N400 Enlarged prostate without lower urinary tract symptoms: Secondary | ICD-10-CM | POA: Diagnosis not present

## 2015-02-17 DIAGNOSIS — R7301 Impaired fasting glucose: Secondary | ICD-10-CM | POA: Diagnosis not present

## 2015-02-17 DIAGNOSIS — Z23 Encounter for immunization: Secondary | ICD-10-CM | POA: Diagnosis not present

## 2015-02-17 DIAGNOSIS — H409 Unspecified glaucoma: Secondary | ICD-10-CM | POA: Diagnosis not present

## 2015-03-20 DIAGNOSIS — H4011X2 Primary open-angle glaucoma, moderate stage: Secondary | ICD-10-CM | POA: Diagnosis not present

## 2015-03-28 DIAGNOSIS — Z23 Encounter for immunization: Secondary | ICD-10-CM | POA: Diagnosis not present

## 2015-03-28 DIAGNOSIS — M546 Pain in thoracic spine: Secondary | ICD-10-CM | POA: Diagnosis not present

## 2015-03-29 DIAGNOSIS — M62830 Muscle spasm of back: Secondary | ICD-10-CM | POA: Diagnosis not present

## 2015-03-29 DIAGNOSIS — M9902 Segmental and somatic dysfunction of thoracic region: Secondary | ICD-10-CM | POA: Diagnosis not present

## 2015-03-29 DIAGNOSIS — M9903 Segmental and somatic dysfunction of lumbar region: Secondary | ICD-10-CM | POA: Diagnosis not present

## 2015-03-29 DIAGNOSIS — M5125 Other intervertebral disc displacement, thoracolumbar region: Secondary | ICD-10-CM | POA: Diagnosis not present

## 2015-03-29 DIAGNOSIS — M5417 Radiculopathy, lumbosacral region: Secondary | ICD-10-CM | POA: Diagnosis not present

## 2015-03-29 DIAGNOSIS — M5127 Other intervertebral disc displacement, lumbosacral region: Secondary | ICD-10-CM | POA: Diagnosis not present

## 2015-03-31 DIAGNOSIS — M5127 Other intervertebral disc displacement, lumbosacral region: Secondary | ICD-10-CM | POA: Diagnosis not present

## 2015-03-31 DIAGNOSIS — M62830 Muscle spasm of back: Secondary | ICD-10-CM | POA: Diagnosis not present

## 2015-03-31 DIAGNOSIS — M5417 Radiculopathy, lumbosacral region: Secondary | ICD-10-CM | POA: Diagnosis not present

## 2015-03-31 DIAGNOSIS — M9903 Segmental and somatic dysfunction of lumbar region: Secondary | ICD-10-CM | POA: Diagnosis not present

## 2015-03-31 DIAGNOSIS — M5125 Other intervertebral disc displacement, thoracolumbar region: Secondary | ICD-10-CM | POA: Diagnosis not present

## 2015-03-31 DIAGNOSIS — M9902 Segmental and somatic dysfunction of thoracic region: Secondary | ICD-10-CM | POA: Diagnosis not present

## 2015-04-03 DIAGNOSIS — M5127 Other intervertebral disc displacement, lumbosacral region: Secondary | ICD-10-CM | POA: Diagnosis not present

## 2015-04-03 DIAGNOSIS — M9903 Segmental and somatic dysfunction of lumbar region: Secondary | ICD-10-CM | POA: Diagnosis not present

## 2015-04-03 DIAGNOSIS — M5125 Other intervertebral disc displacement, thoracolumbar region: Secondary | ICD-10-CM | POA: Diagnosis not present

## 2015-04-03 DIAGNOSIS — M9902 Segmental and somatic dysfunction of thoracic region: Secondary | ICD-10-CM | POA: Diagnosis not present

## 2015-04-03 DIAGNOSIS — M5417 Radiculopathy, lumbosacral region: Secondary | ICD-10-CM | POA: Diagnosis not present

## 2015-04-03 DIAGNOSIS — M62830 Muscle spasm of back: Secondary | ICD-10-CM | POA: Diagnosis not present

## 2015-04-05 DIAGNOSIS — M62830 Muscle spasm of back: Secondary | ICD-10-CM | POA: Diagnosis not present

## 2015-04-05 DIAGNOSIS — M5125 Other intervertebral disc displacement, thoracolumbar region: Secondary | ICD-10-CM | POA: Diagnosis not present

## 2015-04-05 DIAGNOSIS — M5127 Other intervertebral disc displacement, lumbosacral region: Secondary | ICD-10-CM | POA: Diagnosis not present

## 2015-04-05 DIAGNOSIS — M9903 Segmental and somatic dysfunction of lumbar region: Secondary | ICD-10-CM | POA: Diagnosis not present

## 2015-04-05 DIAGNOSIS — M5417 Radiculopathy, lumbosacral region: Secondary | ICD-10-CM | POA: Diagnosis not present

## 2015-04-05 DIAGNOSIS — M9902 Segmental and somatic dysfunction of thoracic region: Secondary | ICD-10-CM | POA: Diagnosis not present

## 2015-04-06 DIAGNOSIS — M5417 Radiculopathy, lumbosacral region: Secondary | ICD-10-CM | POA: Diagnosis not present

## 2015-04-06 DIAGNOSIS — M62830 Muscle spasm of back: Secondary | ICD-10-CM | POA: Diagnosis not present

## 2015-04-06 DIAGNOSIS — M9902 Segmental and somatic dysfunction of thoracic region: Secondary | ICD-10-CM | POA: Diagnosis not present

## 2015-04-06 DIAGNOSIS — M5125 Other intervertebral disc displacement, thoracolumbar region: Secondary | ICD-10-CM | POA: Diagnosis not present

## 2015-04-06 DIAGNOSIS — M9903 Segmental and somatic dysfunction of lumbar region: Secondary | ICD-10-CM | POA: Diagnosis not present

## 2015-04-06 DIAGNOSIS — M5127 Other intervertebral disc displacement, lumbosacral region: Secondary | ICD-10-CM | POA: Diagnosis not present

## 2015-04-10 DIAGNOSIS — M5125 Other intervertebral disc displacement, thoracolumbar region: Secondary | ICD-10-CM | POA: Diagnosis not present

## 2015-04-10 DIAGNOSIS — M9903 Segmental and somatic dysfunction of lumbar region: Secondary | ICD-10-CM | POA: Diagnosis not present

## 2015-04-10 DIAGNOSIS — M5417 Radiculopathy, lumbosacral region: Secondary | ICD-10-CM | POA: Diagnosis not present

## 2015-04-10 DIAGNOSIS — M5127 Other intervertebral disc displacement, lumbosacral region: Secondary | ICD-10-CM | POA: Diagnosis not present

## 2015-04-10 DIAGNOSIS — M62830 Muscle spasm of back: Secondary | ICD-10-CM | POA: Diagnosis not present

## 2015-04-10 DIAGNOSIS — M9902 Segmental and somatic dysfunction of thoracic region: Secondary | ICD-10-CM | POA: Diagnosis not present

## 2015-04-17 DIAGNOSIS — M9902 Segmental and somatic dysfunction of thoracic region: Secondary | ICD-10-CM | POA: Diagnosis not present

## 2015-04-17 DIAGNOSIS — M5417 Radiculopathy, lumbosacral region: Secondary | ICD-10-CM | POA: Diagnosis not present

## 2015-04-17 DIAGNOSIS — M62830 Muscle spasm of back: Secondary | ICD-10-CM | POA: Diagnosis not present

## 2015-04-17 DIAGNOSIS — M9903 Segmental and somatic dysfunction of lumbar region: Secondary | ICD-10-CM | POA: Diagnosis not present

## 2015-04-17 DIAGNOSIS — M5125 Other intervertebral disc displacement, thoracolumbar region: Secondary | ICD-10-CM | POA: Diagnosis not present

## 2015-04-17 DIAGNOSIS — M5127 Other intervertebral disc displacement, lumbosacral region: Secondary | ICD-10-CM | POA: Diagnosis not present

## 2015-04-19 DIAGNOSIS — M9903 Segmental and somatic dysfunction of lumbar region: Secondary | ICD-10-CM | POA: Diagnosis not present

## 2015-04-19 DIAGNOSIS — M62830 Muscle spasm of back: Secondary | ICD-10-CM | POA: Diagnosis not present

## 2015-04-19 DIAGNOSIS — M5417 Radiculopathy, lumbosacral region: Secondary | ICD-10-CM | POA: Diagnosis not present

## 2015-04-19 DIAGNOSIS — M9902 Segmental and somatic dysfunction of thoracic region: Secondary | ICD-10-CM | POA: Diagnosis not present

## 2015-04-19 DIAGNOSIS — M5127 Other intervertebral disc displacement, lumbosacral region: Secondary | ICD-10-CM | POA: Diagnosis not present

## 2015-04-19 DIAGNOSIS — M5125 Other intervertebral disc displacement, thoracolumbar region: Secondary | ICD-10-CM | POA: Diagnosis not present

## 2015-04-24 DIAGNOSIS — M5127 Other intervertebral disc displacement, lumbosacral region: Secondary | ICD-10-CM | POA: Diagnosis not present

## 2015-04-24 DIAGNOSIS — M9903 Segmental and somatic dysfunction of lumbar region: Secondary | ICD-10-CM | POA: Diagnosis not present

## 2015-04-24 DIAGNOSIS — M5417 Radiculopathy, lumbosacral region: Secondary | ICD-10-CM | POA: Diagnosis not present

## 2015-04-24 DIAGNOSIS — M9902 Segmental and somatic dysfunction of thoracic region: Secondary | ICD-10-CM | POA: Diagnosis not present

## 2015-04-24 DIAGNOSIS — M5125 Other intervertebral disc displacement, thoracolumbar region: Secondary | ICD-10-CM | POA: Diagnosis not present

## 2015-04-24 DIAGNOSIS — M62830 Muscle spasm of back: Secondary | ICD-10-CM | POA: Diagnosis not present

## 2015-04-27 DIAGNOSIS — M5125 Other intervertebral disc displacement, thoracolumbar region: Secondary | ICD-10-CM | POA: Diagnosis not present

## 2015-04-27 DIAGNOSIS — M5417 Radiculopathy, lumbosacral region: Secondary | ICD-10-CM | POA: Diagnosis not present

## 2015-04-27 DIAGNOSIS — M5127 Other intervertebral disc displacement, lumbosacral region: Secondary | ICD-10-CM | POA: Diagnosis not present

## 2015-04-27 DIAGNOSIS — M9902 Segmental and somatic dysfunction of thoracic region: Secondary | ICD-10-CM | POA: Diagnosis not present

## 2015-04-27 DIAGNOSIS — M62830 Muscle spasm of back: Secondary | ICD-10-CM | POA: Diagnosis not present

## 2015-04-27 DIAGNOSIS — M9903 Segmental and somatic dysfunction of lumbar region: Secondary | ICD-10-CM | POA: Diagnosis not present

## 2015-05-05 DIAGNOSIS — M546 Pain in thoracic spine: Secondary | ICD-10-CM | POA: Diagnosis not present

## 2015-05-08 DIAGNOSIS — M546 Pain in thoracic spine: Secondary | ICD-10-CM | POA: Diagnosis not present

## 2015-05-10 DIAGNOSIS — M546 Pain in thoracic spine: Secondary | ICD-10-CM | POA: Diagnosis not present

## 2015-05-16 DIAGNOSIS — M546 Pain in thoracic spine: Secondary | ICD-10-CM | POA: Diagnosis not present

## 2015-05-19 DIAGNOSIS — M546 Pain in thoracic spine: Secondary | ICD-10-CM | POA: Diagnosis not present

## 2015-05-30 DIAGNOSIS — M546 Pain in thoracic spine: Secondary | ICD-10-CM | POA: Diagnosis not present

## 2015-06-02 DIAGNOSIS — M546 Pain in thoracic spine: Secondary | ICD-10-CM | POA: Diagnosis not present

## 2015-06-06 DIAGNOSIS — M546 Pain in thoracic spine: Secondary | ICD-10-CM | POA: Diagnosis not present

## 2015-06-09 DIAGNOSIS — M546 Pain in thoracic spine: Secondary | ICD-10-CM | POA: Diagnosis not present

## 2015-06-15 DIAGNOSIS — M546 Pain in thoracic spine: Secondary | ICD-10-CM | POA: Diagnosis not present

## 2015-07-10 DIAGNOSIS — H401132 Primary open-angle glaucoma, bilateral, moderate stage: Secondary | ICD-10-CM | POA: Diagnosis not present

## 2015-08-17 DIAGNOSIS — M542 Cervicalgia: Secondary | ICD-10-CM | POA: Diagnosis not present

## 2015-08-17 DIAGNOSIS — H409 Unspecified glaucoma: Secondary | ICD-10-CM | POA: Diagnosis not present

## 2015-08-17 DIAGNOSIS — E782 Mixed hyperlipidemia: Secondary | ICD-10-CM | POA: Diagnosis not present

## 2015-08-17 DIAGNOSIS — Z86010 Personal history of colonic polyps: Secondary | ICD-10-CM | POA: Diagnosis not present

## 2015-08-17 DIAGNOSIS — I10 Essential (primary) hypertension: Secondary | ICD-10-CM | POA: Diagnosis not present

## 2015-08-17 DIAGNOSIS — Z7189 Other specified counseling: Secondary | ICD-10-CM | POA: Diagnosis not present

## 2015-08-17 DIAGNOSIS — N400 Enlarged prostate without lower urinary tract symptoms: Secondary | ICD-10-CM | POA: Diagnosis not present

## 2015-08-17 DIAGNOSIS — M545 Low back pain: Secondary | ICD-10-CM | POA: Diagnosis not present

## 2015-08-17 DIAGNOSIS — Z0000 Encounter for general adult medical examination without abnormal findings: Secondary | ICD-10-CM | POA: Diagnosis not present

## 2015-10-10 DIAGNOSIS — D2262 Melanocytic nevi of left upper limb, including shoulder: Secondary | ICD-10-CM | POA: Diagnosis not present

## 2015-10-10 DIAGNOSIS — D225 Melanocytic nevi of trunk: Secondary | ICD-10-CM | POA: Diagnosis not present

## 2015-10-10 DIAGNOSIS — Z85828 Personal history of other malignant neoplasm of skin: Secondary | ICD-10-CM | POA: Diagnosis not present

## 2015-10-10 DIAGNOSIS — L821 Other seborrheic keratosis: Secondary | ICD-10-CM | POA: Diagnosis not present

## 2015-10-10 DIAGNOSIS — D1801 Hemangioma of skin and subcutaneous tissue: Secondary | ICD-10-CM | POA: Diagnosis not present

## 2015-11-15 DIAGNOSIS — H401132 Primary open-angle glaucoma, bilateral, moderate stage: Secondary | ICD-10-CM | POA: Diagnosis not present

## 2015-11-17 DIAGNOSIS — M546 Pain in thoracic spine: Secondary | ICD-10-CM | POA: Diagnosis not present

## 2015-11-20 DIAGNOSIS — M546 Pain in thoracic spine: Secondary | ICD-10-CM | POA: Diagnosis not present

## 2015-11-23 DIAGNOSIS — M546 Pain in thoracic spine: Secondary | ICD-10-CM | POA: Diagnosis not present

## 2015-12-06 DIAGNOSIS — M546 Pain in thoracic spine: Secondary | ICD-10-CM | POA: Diagnosis not present

## 2015-12-11 DIAGNOSIS — M546 Pain in thoracic spine: Secondary | ICD-10-CM | POA: Diagnosis not present

## 2015-12-15 DIAGNOSIS — M546 Pain in thoracic spine: Secondary | ICD-10-CM | POA: Diagnosis not present

## 2016-02-20 DIAGNOSIS — I10 Essential (primary) hypertension: Secondary | ICD-10-CM | POA: Diagnosis not present

## 2016-02-20 DIAGNOSIS — E782 Mixed hyperlipidemia: Secondary | ICD-10-CM | POA: Diagnosis not present

## 2016-02-20 DIAGNOSIS — M545 Low back pain: Secondary | ICD-10-CM | POA: Diagnosis not present

## 2016-02-20 DIAGNOSIS — N400 Enlarged prostate without lower urinary tract symptoms: Secondary | ICD-10-CM | POA: Diagnosis not present

## 2016-02-20 DIAGNOSIS — H6123 Impacted cerumen, bilateral: Secondary | ICD-10-CM | POA: Diagnosis not present

## 2016-02-20 DIAGNOSIS — G8929 Other chronic pain: Secondary | ICD-10-CM | POA: Diagnosis not present

## 2016-02-20 DIAGNOSIS — H409 Unspecified glaucoma: Secondary | ICD-10-CM | POA: Diagnosis not present

## 2016-02-20 DIAGNOSIS — F439 Reaction to severe stress, unspecified: Secondary | ICD-10-CM | POA: Diagnosis not present

## 2016-02-20 DIAGNOSIS — Z23 Encounter for immunization: Secondary | ICD-10-CM | POA: Diagnosis not present

## 2016-03-19 DIAGNOSIS — H401132 Primary open-angle glaucoma, bilateral, moderate stage: Secondary | ICD-10-CM | POA: Diagnosis not present

## 2016-07-10 DIAGNOSIS — H401132 Primary open-angle glaucoma, bilateral, moderate stage: Secondary | ICD-10-CM | POA: Diagnosis not present

## 2016-08-21 ENCOUNTER — Inpatient Hospital Stay (HOSPITAL_COMMUNITY)
Admission: AD | Admit: 2016-08-21 | Discharge: 2016-08-23 | DRG: 378 | Disposition: A | Discharge status: Home / Self Care | Payer: Medicare Other | Source: Ambulatory Visit | Attending: Gastroenterology | Admitting: Gastroenterology

## 2016-08-21 DIAGNOSIS — K921 Melena: Secondary | ICD-10-CM

## 2016-08-21 DIAGNOSIS — D120 Benign neoplasm of cecum: Secondary | ICD-10-CM | POA: Diagnosis present

## 2016-08-21 DIAGNOSIS — I10 Essential (primary) hypertension: Secondary | ICD-10-CM | POA: Diagnosis present

## 2016-08-21 DIAGNOSIS — D122 Benign neoplasm of ascending colon: Secondary | ICD-10-CM | POA: Diagnosis present

## 2016-08-21 DIAGNOSIS — Z8249 Family history of ischemic heart disease and other diseases of the circulatory system: Secondary | ICD-10-CM

## 2016-08-21 DIAGNOSIS — Z8711 Personal history of peptic ulcer disease: Secondary | ICD-10-CM

## 2016-08-21 DIAGNOSIS — K5731 Diverticulosis of large intestine without perforation or abscess with bleeding: Secondary | ICD-10-CM | POA: Diagnosis not present

## 2016-08-21 DIAGNOSIS — E039 Hypothyroidism, unspecified: Secondary | ICD-10-CM | POA: Diagnosis not present

## 2016-08-21 DIAGNOSIS — D62 Acute posthemorrhagic anemia: Secondary | ICD-10-CM | POA: Diagnosis present

## 2016-08-21 DIAGNOSIS — K922 Gastrointestinal hemorrhage, unspecified: Secondary | ICD-10-CM | POA: Diagnosis present

## 2016-08-21 DIAGNOSIS — K625 Hemorrhage of anus and rectum: Secondary | ICD-10-CM | POA: Diagnosis not present

## 2016-08-21 DIAGNOSIS — H409 Unspecified glaucoma: Secondary | ICD-10-CM | POA: Diagnosis not present

## 2016-08-21 DIAGNOSIS — D649 Anemia, unspecified: Secondary | ICD-10-CM | POA: Diagnosis not present

## 2016-08-21 DIAGNOSIS — E782 Mixed hyperlipidemia: Secondary | ICD-10-CM | POA: Diagnosis not present

## 2016-08-21 DIAGNOSIS — K5730 Diverticulosis of large intestine without perforation or abscess without bleeding: Secondary | ICD-10-CM | POA: Diagnosis not present

## 2016-08-21 DIAGNOSIS — D500 Iron deficiency anemia secondary to blood loss (chronic): Secondary | ICD-10-CM | POA: Diagnosis not present

## 2016-08-21 DIAGNOSIS — K635 Polyp of colon: Secondary | ICD-10-CM | POA: Diagnosis not present

## 2016-08-21 LAB — CBC
HEMATOCRIT: 44 % (ref 39.0–52.0)
Hemoglobin: 15.5 g/dL (ref 13.0–17.0)
MCH: 32.4 pg (ref 26.0–34.0)
MCHC: 35.2 g/dL (ref 30.0–36.0)
MCV: 91.9 fL (ref 78.0–100.0)
Platelets: 202 10*3/uL (ref 150–400)
RBC: 4.79 MIL/uL (ref 4.22–5.81)
RDW: 13.2 % (ref 11.5–15.5)
WBC: 12.7 10*3/uL — ABNORMAL HIGH (ref 4.0–10.5)

## 2016-08-21 LAB — COMPREHENSIVE METABOLIC PANEL
ALT: 21 U/L (ref 17–63)
ANION GAP: 9 (ref 5–15)
AST: 22 U/L (ref 15–41)
Albumin: 4.4 g/dL (ref 3.5–5.0)
Alkaline Phosphatase: 74 U/L (ref 38–126)
BILIRUBIN TOTAL: 1.2 mg/dL (ref 0.3–1.2)
BUN: 11 mg/dL (ref 6–20)
CO2: 28 mmol/L (ref 22–32)
Calcium: 9.2 mg/dL (ref 8.9–10.3)
Chloride: 104 mmol/L (ref 101–111)
Creatinine, Ser: 0.86 mg/dL (ref 0.61–1.24)
GFR calc Af Amer: >60 mL/min (ref >60)
Glucose, Bld: 117 mg/dL — ABNORMAL HIGH (ref 65–99)
POTASSIUM: 4.4 mmol/L (ref 3.5–5.1)
Sodium: 141 mmol/L (ref 135–145)
TOTAL PROTEIN: 7.2 g/dL (ref 6.5–8.1)

## 2016-08-21 MED ORDER — TIMOLOL MALEATE 0.5 % OP SOLN: 1.0000 [drp] | Freq: Every morning | OPHTHALMIC | Status: DC

## 2016-08-21 MED ORDER — PEG 3350-KCL-NA BICARB-NACL 420 G PO SOLR
4000.0000 mL | Freq: Once | ORAL | Status: AC
  Administered 2016-08-22: 4000 mL via ORAL
  Filled 2016-08-21: qty 4000

## 2016-08-21 MED ORDER — LATANOPROST 0.005 % OP SOLN
1.0000 [drp] | Freq: Every day | OPHTHALMIC | Status: DC
  Administered 2016-08-22: 1 [drp] via OPHTHALMIC
  Filled 2016-08-21: qty 2.5

## 2016-08-21 MED ORDER — SODIUM CHLORIDE 0.9 % IV SOLN
INTRAVENOUS | Status: DC
  Administered 2016-08-21 – 2016-08-22 (×2): via INTRAVENOUS

## 2016-08-21 NOTE — H&P (Signed)
Derrick Harris HPI: On 07/12/2013 the patient's colonoscopy was revealing for a cecal and transverse colon adenoma.  Scattered diverticula were also found.  He complains about melena that started acutely last night when he was taking a shower.  The melena was in the form of diarrhea and he reports having a "ton of blood".  He woke up at 2:30 and 4:30 AM with more melena.  Currently the patient denies any issues with SOB or chest pain.  No prior history of MI.  In the past, 1985, he had a bleeding ulcer and he feels that he had a similar presentation.  As a result of the ulcer, he denies any use of NSAIDs.  No associated abdominal pain.    Past Medical History:  Diagnosis Date  . Bleeding ulcer    slight    No past surgical history on file.  Family History  Problem Relation Age of Onset  . Heart disease Mother   . Heart disease Father     Social History:  reports that he has never smoked. He has never used smokeless tobacco. He reports that he does not drink alcohol or use drugs.  Allergies:  Allergies  Allergen Reactions  . Aspirin Other (See Comments)    REACTION: bleeding ulcers    Medications:  Scheduled:  Continuous: . sodium chloride      No results found for this or any previous visit (from the past 24 hour(s)).   No results found.  ROS:  As stated above in the HPI otherwise negative.  Blood pressure 125/82, pulse (!) 115, temperature 98.4 F (36.9 C), temperature source Oral, SpO2 99 %.    PE: Gen: NAD, Alert and Oriented HEENT:  [place]/AT, EOMI Neck: Supple, no LAD Lungs: CTA Bilaterally CV: RRR without M/G/R ABM: Soft, NTND, +BS Ext: No C/C/E Rectal: Maroon stool  Assessment/Plan: 1) Hematochezia. 2) Diverticula. 3) Glaucoma. 4) HTN.   The patient's rectal examination was significant for maroon stool. He has a history of diverticula and I suspect that he has a diverticular bleed. Currently he is hemodynamically stable, but with his presentation further  evaluation and treatment is required. I will admit him to the hospital and pursue an EGD/Colonoscopy. He reports a distant history of PUD and this is the driving factor for the repeat procedures.   Plan: 1) Admit. 2) Clear liquid diet. 3) Check CBC and BMP. 4) Plan for EGD/Colonoscopy on Friday.  Ileah Falkenstein D 08/21/2016, 5:30 PM

## 2016-08-22 DIAGNOSIS — K922 Gastrointestinal hemorrhage, unspecified: Secondary | ICD-10-CM | POA: Diagnosis present

## 2016-08-22 LAB — CBC
HCT: 36.5 % — ABNORMAL LOW (ref 39.0–52.0)
Hemoglobin: 12.5 g/dL — ABNORMAL LOW (ref 13.0–17.0)
MCH: 31.2 pg (ref 26.0–34.0)
MCHC: 34.2 g/dL (ref 30.0–36.0)
MCV: 91 fL (ref 78.0–100.0)
PLATELETS: 167 10*3/uL (ref 150–400)
RBC: 4.01 MIL/uL — ABNORMAL LOW (ref 4.22–5.81)
RDW: 12.9 % (ref 11.5–15.5)
WBC: 8.3 10*3/uL (ref 4.0–10.5)

## 2016-08-22 LAB — BASIC METABOLIC PANEL
ANION GAP: 10 (ref 5–15)
BUN: 9 mg/dL (ref 6–20)
CALCIUM: 8.4 mg/dL — AB (ref 8.9–10.3)
CO2: 27 mmol/L (ref 22–32)
Chloride: 104 mmol/L (ref 101–111)
Creatinine, Ser: 0.71 mg/dL (ref 0.61–1.24)
GFR calc Af Amer: >60 mL/min (ref >60)
GLUCOSE: 108 mg/dL — AB (ref 65–99)
Potassium: 3.7 mmol/L (ref 3.5–5.1)
Sodium: 141 mmol/L (ref 135–145)

## 2016-08-22 MED ORDER — LISINOPRIL 10 MG PO TABS
10.0000 mg | ORAL_TABLET | Freq: Every day | ORAL | Status: DC
  Filled 2016-08-22: qty 1

## 2016-08-22 NOTE — Progress Notes (Signed)
Subjective: No further bleeding since yesterday afternoon.    Objective: Vital signs in last 24 hours: Temp:  [97.8 F (36.6 C)-98.4 F (36.9 C)] 97.8 F (36.6 C) (02/22 0507) Pulse Rate:  [86-115] 86 (02/22 0507) Resp:  [16] 16 (02/22 0507) BP: (125-155)/(69-82) 155/74 (02/22 0507) SpO2:  [97 %-99 %] 97 % (02/22 0507) Last BM Date: 08/21/16  Intake/Output from previous day: 02/21 0701 - 02/22 0700 In: 1456.8 [P.O.:758; I.V.:698.8] Out: 350 [Urine:350] Intake/Output this shift: No intake/output data recorded.  General appearance: alert and no distress GI: soft, non-tender; bowel sounds normal; no masses,  no organomegaly  Lab Results:  Recent Labs  08/21/16 1712 08/22/16 0546  WBC 12.7* 8.3  HGB 15.5 12.5*  HCT 44.0 36.5*  PLT 202 167   BMET  Recent Labs  08/21/16 1712 08/22/16 0546  NA 141 141  K 4.4 3.7  CL 104 104  CO2 28 27  GLUCOSE 117* 108*  BUN 11 9  CREATININE 0.86 0.71  CALCIUM 9.2 8.4*   LFT  Recent Labs  08/21/16 1712  PROT 7.2  ALBUMIN 4.4  AST 22  ALT 21  ALKPHOS 74  BILITOT 1.2   PT/INR No results for input(s): LABPROT, INR in the last 72 hours. Hepatitis Panel No results for input(s): HEPBSAG, HCVAB, HEPAIGM, HEPBIGM in the last 72 hours. C-Diff No results for input(s): CDIFFTOX in the last 72 hours. Fecal Lactopherrin No results for input(s): FECLLACTOFRN in the last 72 hours.  Studies/Results: No results found.  Medications:  Scheduled: . latanoprost  1 drop Both Eyes QHS  . polyethylene glycol-electrolytes  4,000 mL Oral Once  . timolol  1 drop Both Eyes q morning - 10a   Continuous: . sodium chloride 75 mL/hr at 08/22/16 0840    Assessment/Plan: 1) Hematochezia - probable diverticular bleed. 2) History of PUD with similar presentation. 3) HTN.   HGB dropped as anticipated.  Hemodynamically he is stable, in fact, he is hypertensive at this time.  I will restart him back on his lisinopril and continue to  monitor his HGB.  Plan: 1) CBC in the AM. 2) EGD/Colonoscopy tomorrow in the AM. 3) Restart linsinopril 10 mg QD.  LOS: 1 day   Lena Fieldhouse D 08/22/2016, 9:10 AM

## 2016-08-22 NOTE — Consult Note (Signed)
[facility] [place] Primary Care Navigator  08/22/2016  MARTELL MCFADYEN 03-02-1946 [id number]  Met with patient at the bedside to identify possible discharge needs. Patient reports having blood in the stool (sudden) that led to this admission. Patient endorses Dr. Cari Caraway with [place] [facility] as the primary care provider.    Patient shared [facility] to obtain medications without any problem.   Patient states he manages his own medications at home straight out of the containers.  He drives prior to this admission. Wife Lelon Frohlich) will be providing transportation to his doctors' appointments after discharge when needed.  Wife will be his primary caregiver at home as stated.   Discharge plan is home per patient.  Patient voiced understanding to call primary care provider's office when he returns home, for a post discharge follow-up appointment within a week or sooner if needs arise. Patient letter (with PCP's contact number) was provided as a reminder.  Patient voiced no other needs or concerns with health management at present.  For additional questions please contact:  Edwena Felty A. Joshva Labreck, BSN, RN-BC [facility] PRIMARY CARE Navigator Cell: [phone]

## 2016-08-23 ENCOUNTER — Encounter (HOSPITAL_COMMUNITY)
Admission: AD | Disposition: A | Discharge status: Home / Self Care | Payer: Self-pay | Source: Ambulatory Visit | Attending: Gastroenterology

## 2016-08-23 ENCOUNTER — Inpatient Hospital Stay (HOSPITAL_COMMUNITY): Payer: Medicare Other | Admitting: Anesthesiology

## 2016-08-23 ENCOUNTER — Encounter (HOSPITAL_COMMUNITY): Payer: Self-pay | Admitting: *Deleted

## 2016-08-23 HISTORY — PX: ESOPHAGOGASTRODUODENOSCOPY: SHX5428

## 2016-08-23 HISTORY — PX: COLONOSCOPY WITH PROPOFOL: SHX5780

## 2016-08-23 LAB — BASIC METABOLIC PANEL
ANION GAP: 8 (ref 5–15)
BUN: 5 mg/dL — ABNORMAL LOW (ref 6–20)
CALCIUM: 8.4 mg/dL — AB (ref 8.9–10.3)
CO2: 24 mmol/L (ref 22–32)
Chloride: 106 mmol/L (ref 101–111)
Creatinine, Ser: 0.64 mg/dL (ref 0.61–1.24)
GFR calc Af Amer: >60 mL/min (ref >60)
Glucose, Bld: 109 mg/dL — ABNORMAL HIGH (ref 65–99)
POTASSIUM: 3.4 mmol/L — AB (ref 3.5–5.1)
SODIUM: 138 mmol/L (ref 135–145)

## 2016-08-23 LAB — CBC
HCT: 36.6 % — ABNORMAL LOW (ref 39.0–52.0)
Hemoglobin: 12.8 g/dL — ABNORMAL LOW (ref 13.0–17.0)
MCH: 32.2 pg (ref 26.0–34.0)
MCHC: 35 g/dL (ref 30.0–36.0)
MCV: 92 fL (ref 78.0–100.0)
PLATELETS: 171 10*3/uL (ref 150–400)
RBC: 3.98 MIL/uL — AB (ref 4.22–5.81)
RDW: 13 % (ref 11.5–15.5)
WBC: 9.9 10*3/uL (ref 4.0–10.5)

## 2016-08-23 SURGERY — EGD (ESOPHAGOGASTRODUODENOSCOPY)
Anesthesia: Monitor Anesthesia Care

## 2016-08-23 MED ORDER — PROPOFOL 10 MG/ML IV BOLUS
INTRAVENOUS | Status: DC | PRN
  Administered 2016-08-23: 50 mg via INTRAVENOUS
  Administered 2016-08-23: 30 mg via INTRAVENOUS
  Administered 2016-08-23: 20 mg via INTRAVENOUS
  Administered 2016-08-23 (×2): 30 mg via INTRAVENOUS

## 2016-08-23 MED ORDER — ONDANSETRON HCL 4 MG/2ML IJ SOLN
INTRAMUSCULAR | Status: DC | PRN
  Administered 2016-08-23: 4 mg via INTRAVENOUS

## 2016-08-23 MED ORDER — SODIUM CHLORIDE 0.9 % IV SOLN
INTRAVENOUS | Status: DC | PRN
  Administered 2016-08-23: 07:00:00 via INTRAVENOUS

## 2016-08-23 MED ORDER — LIDOCAINE HCL (CARDIAC) 20 MG/ML IV SOLN
INTRAVENOUS | Status: DC | PRN
  Administered 2016-08-23: 50 mg via INTRATRACHEAL

## 2016-08-23 MED ORDER — SODIUM CHLORIDE 0.9 % IV SOLN: INTRAVENOUS | Status: DC

## 2016-08-23 NOTE — Interval H&P Note (Signed)
History and Physical Interval Note:  08/23/2016 7:19 AM  Derrick Harris  has presented today for surgery, with the diagnosis of Hematochezia and anemia  The various methods of treatment have been discussed with the patient and family. After consideration of risks, benefits and other options for treatment, the patient has consented to  Procedure(s): ESOPHAGOGASTRODUODENOSCOPY (EGD) (N/A) COLONOSCOPY WITH PROPOFOL (Left) as a surgical intervention .  The patient's history has been reviewed, patient examined, no change in status, stable for surgery.  I have reviewed the patient's chart and labs.  Questions were answered to the patient's satisfaction.     Daielle Melcher D

## 2016-08-23 NOTE — Discharge Summary (Signed)
Physician Discharge Summary  Patient ID: Derrick Harris MRN: [id number] DOB/AGE: 07-Nov-1945 71 y.o.  Admit date: 08/21/2016 Discharge date: 08/23/2016  Admission Diagnoses: GI bleed  Discharge Diagnoses: Diverticular bleed Active Problems:   Hematochezia   GI bleed   Discharged Condition: good  Hospital Course: The patient was admitted from the office with complaints of melena, but the rectal examination was significant for maroon stool.  His HGB did drop down as anticipated by 3 grams, 15.5 g/dL to 12.5 g/dL.  After the first night of admission he did not have any further hematochezia.  The patient was prepped for an EGD/Colonoscopy as he had a history of a bleeding peptic ulcer in the past and he stated that he had a similar presentation.  His EGD was negative and his colonoscopy was significant for three proximal colon polyps and pan diverticulosis.  No evidence of any blood in the colon.  His blood pressure was elevated, but he was restarted on his lisinopril on hospital day #2 as his blood pressure was normotensive during the evening of hospital day #1.  He will resume all of his home meds and follow up with Dr. Collene Mares in one month.  Consults: None  Significant Diagnostic Studies: labs: CBC and endoscopy: colonoscopy: see above and gastroscopy: see above  Treatments: IV hydration  Discharge Exam: Blood pressure (!) 156/70, pulse 90, temperature 97.5 F (36.4 C), temperature source Oral, resp. rate 13, height 5' 9.5" (1.765 m), weight 74.8 kg (165 lb), SpO2 100 %. General appearance: alert and no distress Resp: clear to auscultation bilaterally Cardio: regular rate and rhythm GI: soft, non-tender; bowel sounds normal; no masses,  no organomegaly Extremities: extremities normal, atraumatic, no cyanosis or edema  Disposition: Final discharge disposition not confirmed   Allergies as of 08/23/2016      Reactions   Aspirin Other (See Comments)   REACTION: bleeding ulcers       Medication List    TAKE these medications   acetaminophen 500 MG tablet Commonly known as:  TYLENOL Take 500 mg by mouth every 6 (six) hours as needed for mild pain.   atorvastatin 10 MG tablet Commonly known as:  LIPITOR Take 10 mg by mouth daily. M W F   lisinopril 10 MG tablet Commonly known as:  PRINIVIL,ZESTRIL Take 10 mg by mouth daily.   multivitamin with minerals Tabs tablet Take 1 tablet by mouth daily.   timolol 0.25 % ophthalmic gel-forming Commonly known as:  TIMOPTIC-XR Place 1 drop into both eyes daily.   TRAVATAN 0.004 % ophthalmic solution Generic drug:  travoprost (benzalkonium) Place 1 drop into both eyes at bedtime. Sees Dr. Gwynn Burly   vitamin C 500 MG tablet Commonly known as:  ASCORBIC ACID Take 500 mg by mouth daily.        SignedCarol Ada D 08/23/2016, 9:02 AM

## 2016-08-23 NOTE — Op Note (Signed)
[facility] Patient Name: Derrick Harris Procedure Date : 08/23/2016 MRN: [id number] Attending MD: Carol Ada , MD Date of Birth: 11/21/1945 CSN: [id number] Age: 71 Admit Type: Inpatient Procedure:                Upper GI endoscopy Indications:              Acute post hemorrhagic anemia, Hematochezia Providers:                Carol Ada, MD, Cleda Daub, RN, Elspeth Cho                            Tech., Technician, Jacquiline Doe, CRNA Referring MD:              Medicines:                Propofol per Anesthesia Complications:            No immediate complications. Estimated Blood Loss:     Estimated blood loss: none. Procedure:                Pre-Anesthesia Assessment:                           - Prior to the procedure, a History and Physical                            was performed, and patient medications and                            allergies were reviewed. The patient's tolerance of                            previous anesthesia was also reviewed. The risks                            and benefits of the procedure and the sedation                            options and risks were discussed with the patient.                            All questions were answered, and informed consent                            was obtained. Prior Anticoagulants: The patient has                            taken no previous anticoagulant or antiplatelet                            agents. ASA Grade Assessment: II - A patient with                            mild systemic disease. After reviewing the risks  and benefits, the patient was deemed in                            satisfactory condition to undergo the procedure.                           - Sedation was administered by an anesthesia                            professional. Deep sedation was attained.                           After obtaining informed consent, the endoscope was      passed under direct vision. Throughout the                            procedure, the patient's blood pressure, pulse, and                            oxygen saturations were monitored continuously. The                            [id number] scope was introduced through                            the mouth, and advanced to the second part of                            duodenum. The upper GI endoscopy was accomplished                            without difficulty. The patient tolerated the                            procedure well. Scope In: Scope Out: Findings:      The esophagus was normal.      The stomach was normal.      The examined duodenum was normal. Impression:               - Normal esophagus.                           - Normal stomach.                           - Normal examined duodenum.                           - No specimens collected. Moderate Sedation:      None Recommendation:           - Return patient to hospital ward for ongoing care.                           - Resume regular diet.                           -  Continue present medications.                           - Proceed with the colonoscopy. Procedure Code(s):        --- Professional ---                           [phone], Esophagogastroduodenoscopy, flexible,                            transoral; diagnostic, including collection of                            specimen(s) by brushing or washing, when performed                            (separate procedure) Diagnosis Code(s):        --- Professional ---                           D62, Acute posthemorrhagic anemia                           K92.1, Melena (includes Hematochezia) CPT copyright 2016 American Medical Association. All rights reserved. The codes documented in this report are preliminary and upon coder review may  be revised to meet current compliance requirements. Carol Ada, MD Carol Ada, MD 08/23/2016 8:09:04 AM This report has been  signed electronically. Number of Addenda: 0

## 2016-08-23 NOTE — Anesthesia Procedure Notes (Signed)
Procedure Name: MAC Date/Time: 08/23/2016 7:31 AM Performed by: Jacquiline Doe A Pre-anesthesia Checklist: Patient identified, Emergency Drugs available, Suction available and Patient being monitored Patient Re-evaluated:Patient Re-evaluated prior to inductionOxygen Delivery Method: Nasal cannula Intubation Type: IV induction Placement Confirmation: positive ETCO2 Dental Injury: Teeth and Oropharynx as per pre-operative assessment

## 2016-08-23 NOTE — Progress Notes (Signed)
Patient discharged to home with instructions. 

## 2016-08-23 NOTE — Transfer of Care (Signed)
Immediate Anesthesia Transfer of Care Note  Patient: Derrick Harris  Procedure(s) Performed: Procedure(s): ESOPHAGOGASTRODUODENOSCOPY (EGD) (N/A) COLONOSCOPY WITH PROPOFOL (Left)  Patient Location: Endoscopy Unit  Anesthesia Type:MAC  Level of Consciousness: awake, oriented, sedated, patient cooperative and responds to stimulation  Airway & Oxygen Therapy: Patient Spontanous Breathing and Patient connected to nasal cannula oxygen  Post-op Assessment: Report given to RN, Post -op Vital signs reviewed and stable, Patient moving all extremities and Patient moving all extremities X 4  Post vital signs: Reviewed and stable  Last Vitals:  Vitals:   08/23/16 0701 08/23/16 0812  BP: (!) 205/81 136/70  Pulse: (!) 108 96  Resp: 11 14  Temp: 37 C     Last Pain:  Vitals:   08/23/16 0701  TempSrc: Oral         Complications: No apparent anesthesia complications

## 2016-08-23 NOTE — Anesthesia Postprocedure Evaluation (Signed)
Anesthesia Post Note  Patient: [name]  Procedure(s) Performed: Procedure(s) (LRB): ESOPHAGOGASTRODUODENOSCOPY (EGD) (N/A) COLONOSCOPY WITH PROPOFOL (Left)  Patient location during evaluation: PACU Anesthesia Type: MAC Level of consciousness: awake Pain management: pain level controlled Vital Signs Assessment: post-procedure vital signs reviewed and stable Respiratory status: spontaneous breathing Cardiovascular status: stable Anesthetic complications: no       Last Vitals:  Vitals:   08/23/16 0820 08/23/16 0830  BP: (!) 156/70 (!) 156/70  Pulse: 87 90  Resp: 16 13  Temp:      Last Pain:  Vitals:   08/23/16 0812  TempSrc: Oral                 Troi Florendo

## 2016-08-23 NOTE — H&P (View-Only) (Signed)
Subjective: No further bleeding since yesterday afternoon.    Objective: Vital signs in last 24 hours: Temp:  [97.8 F (36.6 C)-98.4 F (36.9 C)] 97.8 F (36.6 C) (02/22 0507) Pulse Rate:  [86-115] 86 (02/22 0507) Resp:  [16] 16 (02/22 0507) BP: (125-155)/(69-82) 155/74 (02/22 0507) SpO2:  [97 %-99 %] 97 % (02/22 0507) Last BM Date: 08/21/16  Intake/Output from previous day: 02/21 0701 - 02/22 0700 In: 1456.8 [P.O.:758; I.V.:698.8] Out: 350 [Urine:350] Intake/Output this shift: No intake/output data recorded.  General appearance: alert and no distress GI: soft, non-tender; bowel sounds normal; no masses,  no organomegaly  Lab Results:  Recent Labs  08/21/16 1712 08/22/16 0546  WBC 12.7* 8.3  HGB 15.5 12.5*  HCT 44.0 36.5*  PLT 202 167   BMET  Recent Labs  08/21/16 1712 08/22/16 0546  NA 141 141  K 4.4 3.7  CL 104 104  CO2 28 27  GLUCOSE 117* 108*  BUN 11 9  CREATININE 0.86 0.71  CALCIUM 9.2 8.4*   LFT  Recent Labs  08/21/16 1712  PROT 7.2  ALBUMIN 4.4  AST 22  ALT 21  ALKPHOS 74  BILITOT 1.2   PT/INR No results for input(s): LABPROT, INR in the last 72 hours. Hepatitis Panel No results for input(s): HEPBSAG, HCVAB, HEPAIGM, HEPBIGM in the last 72 hours. C-Diff No results for input(s): CDIFFTOX in the last 72 hours. Fecal Lactopherrin No results for input(s): FECLLACTOFRN in the last 72 hours.  Studies/Results: No results found.  Medications:  Scheduled: . latanoprost  1 drop Both Eyes QHS  . polyethylene glycol-electrolytes  4,000 mL Oral Once  . timolol  1 drop Both Eyes q morning - 10a   Continuous: . sodium chloride 75 mL/hr at 08/22/16 0840    Assessment/Plan: 1) Hematochezia - probable diverticular bleed. 2) History of PUD with similar presentation. 3) HTN.   HGB dropped as anticipated.  Hemodynamically he is stable, in fact, he is hypertensive at this time.  I will restart him back on his lisinopril and continue to  monitor his HGB.  Plan: 1) CBC in the AM. 2) EGD/Colonoscopy tomorrow in the AM. 3) Restart linsinopril 10 mg QD.  LOS: 1 day   Anamika Kueker D 08/22/2016, 9:10 AM

## 2016-08-23 NOTE — Op Note (Signed)
[facility] Patient Name: Derrick Harris Procedure Date : 08/23/2016 MRN: [id number] Attending MD: Carol Ada , MD Date of Birth: 05-May-1946 CSN: [id number] Age: 71 Admit Type: Inpatient Procedure:                Colonoscopy Indications:              Hematochezia Providers:                Carol Ada, MD, Cleda Daub, RN, Elspeth Cho                            Tech., Technician, Jacquiline Doe, CRNA Referring MD:              Medicines:                Propofol per Anesthesia Complications:            No immediate complications. Estimated Blood Loss:     Estimated blood loss was minimal. Procedure:                Pre-Anesthesia Assessment:                           - Prior to the procedure, a History and Physical                            was performed, and patient medications and                            allergies were reviewed. The patient's tolerance of                            previous anesthesia was also reviewed. The risks                            and benefits of the procedure and the sedation                            options and risks were discussed with the patient.                            All questions were answered, and informed consent                            was obtained. Prior Anticoagulants: The patient has                            taken no previous anticoagulant or antiplatelet                            agents. ASA Grade Assessment: II - A patient with                            mild systemic disease. After reviewing the risks  and benefits, the patient was deemed in                            satisfactory condition to undergo the procedure.                           - Sedation was administered by an anesthesia                            professional. Deep sedation was attained.                           After obtaining informed consent, the colonoscope                            was passed under direct  vision. Throughout the                            procedure, the patient's blood pressure, pulse, and                            oxygen saturations were monitored continuously. The                            [id number] scope was introduced through                            the anus and advanced to the the terminal ileum.                            The colonoscopy was performed without difficulty.                            The patient tolerated the procedure well. The                            quality of the bowel preparation was good. The                            terminal ileum, ileocecal valve, appendiceal                            orifice, and rectum were photographed. Scope In: 7:41:24 AM Scope Out: 7:57:07 AM Scope Withdrawal Time: 0 hours 12 minutes 51 seconds  Total Procedure Duration: 0 hours 15 minutes 43 seconds  Findings:      Three sessile polyps were found in the ascending colon and cecum. The       polyps were 2 to 5 mm in size. These polyps were removed with a cold       snare. Resection and retrieval were complete.      Scattered small and large-mouthed diverticula were found in the entire       colon. The greatest concentration of diverticula were in the sigmoid       colon.      No evidence of bleeding,  fresh or old blood, inflammation, or vascular       abnormalities. Impression:               - Three 2 to 5 mm polyps in the ascending colon and                            in the cecum, removed with a cold snare. Resected                            and retrieved.                           - Diverticulosis in the entire examined colon. Moderate Sedation:      None Recommendation:           - Return patient to hospital ward for ongoing care.                           - Resume regular diet.                           - Continue present medications.                           - Await pathology results.                           - Repeat colonoscopy in 3 years  for surveillance. Procedure Code(s):        --- Professional ---                           [phone], Colonoscopy, flexible; with removal of                            tumor(s), polyp(s), or other lesion(s) by snare                            technique Diagnosis Code(s):        --- Professional ---                           D12.2, Benign neoplasm of ascending colon                           D12.0, Benign neoplasm of cecum                           K92.1, Melena (includes Hematochezia)                           K57.30, Diverticulosis of large intestine without                            perforation or abscess without bleeding CPT copyright 2016 [employer]. All rights reserved. The codes documented in this report are preliminary and upon coder review may  be revised to meet current compliance requirements. Carol Ada, MD  Carol Ada, MD 08/23/2016 8:05:54 AM This report has been signed electronically. Number of Addenda: 0

## 2016-08-23 NOTE — Anesthesia Preprocedure Evaluation (Addendum)
Anesthesia Evaluation  Patient identified by MRN, date of birth, ID band Patient awake    Reviewed: Allergy & Precautions, NPO status , Patient's Chart, lab work & pertinent test results  Airway Mallampati: II  TM Distance: >3 FB     Dental   Pulmonary neg pulmonary ROS,    breath sounds clear to auscultation       Cardiovascular negative cardio ROS   Rhythm:Regular Rate:Normal     Neuro/Psych    GI/Hepatic Neg liver ROS, PUD,   Endo/Other  Hypothyroidism   Renal/GU negative Renal ROS     Musculoskeletal   Abdominal   Peds  Hematology  (+) anemia ,   Anesthesia Other Findings   Reproductive/Obstetrics                            Anesthesia Physical Anesthesia Plan  ASA: III  Anesthesia Plan: MAC   Post-op Pain Management:    Induction: Intravenous  Airway Management Planned: Mask and Simple Face Mask  Additional Equipment:   Intra-op Plan:   Post-operative Plan:   Informed Consent: I have reviewed the patients History and Physical, chart, labs and discussed the procedure including the risks, benefits and alternatives for the proposed anesthesia with the patient or authorized representative who has indicated his/her understanding and acceptance.   Dental advisory given  Plan Discussed with: CRNA and Anesthesiologist  Anesthesia Plan Comments:         Anesthesia Quick Evaluation

## 2016-08-23 NOTE — Addendum Note (Signed)
Addendum  created 08/23/16 1047 by Fidela Juneau, CRNA   Charge Capture section accepted

## 2016-08-26 ENCOUNTER — Encounter (HOSPITAL_COMMUNITY): Payer: Self-pay | Admitting: Gastroenterology

## 2016-08-29 DIAGNOSIS — R972 Elevated prostate specific antigen [PSA]: Secondary | ICD-10-CM | POA: Diagnosis not present

## 2016-08-29 DIAGNOSIS — Z1389 Encounter for screening for other disorder: Secondary | ICD-10-CM | POA: Diagnosis not present

## 2016-08-29 DIAGNOSIS — I10 Essential (primary) hypertension: Secondary | ICD-10-CM | POA: Diagnosis not present

## 2016-08-29 DIAGNOSIS — K922 Gastrointestinal hemorrhage, unspecified: Secondary | ICD-10-CM | POA: Diagnosis not present

## 2016-08-29 DIAGNOSIS — R7301 Impaired fasting glucose: Secondary | ICD-10-CM | POA: Diagnosis not present

## 2016-08-29 DIAGNOSIS — E782 Mixed hyperlipidemia: Secondary | ICD-10-CM | POA: Diagnosis not present

## 2016-08-29 DIAGNOSIS — K635 Polyp of colon: Secondary | ICD-10-CM | POA: Diagnosis not present

## 2016-08-29 DIAGNOSIS — M1990 Unspecified osteoarthritis, unspecified site: Secondary | ICD-10-CM | POA: Diagnosis not present

## 2016-08-29 DIAGNOSIS — N400 Enlarged prostate without lower urinary tract symptoms: Secondary | ICD-10-CM | POA: Diagnosis not present

## 2016-08-29 DIAGNOSIS — Z0000 Encounter for general adult medical examination without abnormal findings: Secondary | ICD-10-CM | POA: Diagnosis not present

## 2016-08-29 DIAGNOSIS — H409 Unspecified glaucoma: Secondary | ICD-10-CM | POA: Diagnosis not present

## 2016-09-05 DIAGNOSIS — Z86010 Personal history of colonic polyps: Secondary | ICD-10-CM | POA: Diagnosis not present

## 2016-09-05 DIAGNOSIS — K5730 Diverticulosis of large intestine without perforation or abscess without bleeding: Secondary | ICD-10-CM | POA: Diagnosis not present

## 2016-10-04 DIAGNOSIS — M1990 Unspecified osteoarthritis, unspecified site: Secondary | ICD-10-CM | POA: Diagnosis not present

## 2016-10-04 DIAGNOSIS — H409 Unspecified glaucoma: Secondary | ICD-10-CM | POA: Diagnosis not present

## 2016-10-04 DIAGNOSIS — E782 Mixed hyperlipidemia: Secondary | ICD-10-CM | POA: Diagnosis not present

## 2016-10-04 DIAGNOSIS — K635 Polyp of colon: Secondary | ICD-10-CM | POA: Diagnosis not present

## 2016-10-04 DIAGNOSIS — K922 Gastrointestinal hemorrhage, unspecified: Secondary | ICD-10-CM | POA: Diagnosis not present

## 2016-10-04 DIAGNOSIS — R7301 Impaired fasting glucose: Secondary | ICD-10-CM | POA: Diagnosis not present

## 2016-10-04 DIAGNOSIS — Z1389 Encounter for screening for other disorder: Secondary | ICD-10-CM | POA: Diagnosis not present

## 2016-10-04 DIAGNOSIS — I10 Essential (primary) hypertension: Secondary | ICD-10-CM | POA: Diagnosis not present

## 2016-10-04 DIAGNOSIS — N400 Enlarged prostate without lower urinary tract symptoms: Secondary | ICD-10-CM | POA: Diagnosis not present

## 2016-10-04 DIAGNOSIS — Z0000 Encounter for general adult medical examination without abnormal findings: Secondary | ICD-10-CM | POA: Diagnosis not present

## 2016-10-04 DIAGNOSIS — R972 Elevated prostate specific antigen [PSA]: Secondary | ICD-10-CM | POA: Diagnosis not present

## 2016-10-09 DIAGNOSIS — L814 Other melanin hyperpigmentation: Secondary | ICD-10-CM | POA: Diagnosis not present

## 2016-10-09 DIAGNOSIS — L821 Other seborrheic keratosis: Secondary | ICD-10-CM | POA: Diagnosis not present

## 2016-10-09 DIAGNOSIS — L578 Other skin changes due to chronic exposure to nonionizing radiation: Secondary | ICD-10-CM | POA: Diagnosis not present

## 2016-10-09 DIAGNOSIS — D1801 Hemangioma of skin and subcutaneous tissue: Secondary | ICD-10-CM | POA: Diagnosis not present

## 2016-11-13 DIAGNOSIS — H401132 Primary open-angle glaucoma, bilateral, moderate stage: Secondary | ICD-10-CM | POA: Diagnosis not present

## 2017-03-04 DIAGNOSIS — R142 Eructation: Secondary | ICD-10-CM | POA: Diagnosis not present

## 2017-03-04 DIAGNOSIS — N400 Enlarged prostate without lower urinary tract symptoms: Secondary | ICD-10-CM | POA: Diagnosis not present

## 2017-03-04 DIAGNOSIS — I10 Essential (primary) hypertension: Secondary | ICD-10-CM | POA: Diagnosis not present

## 2017-03-04 DIAGNOSIS — Z125 Encounter for screening for malignant neoplasm of prostate: Secondary | ICD-10-CM | POA: Diagnosis not present

## 2017-03-04 DIAGNOSIS — R350 Frequency of micturition: Secondary | ICD-10-CM | POA: Diagnosis not present

## 2017-03-04 DIAGNOSIS — Z23 Encounter for immunization: Secondary | ICD-10-CM | POA: Diagnosis not present

## 2017-03-04 DIAGNOSIS — H409 Unspecified glaucoma: Secondary | ICD-10-CM | POA: Diagnosis not present

## 2017-03-04 DIAGNOSIS — R7301 Impaired fasting glucose: Secondary | ICD-10-CM | POA: Diagnosis not present

## 2017-03-04 DIAGNOSIS — E782 Mixed hyperlipidemia: Secondary | ICD-10-CM | POA: Diagnosis not present

## 2017-03-04 DIAGNOSIS — R200 Anesthesia of skin: Secondary | ICD-10-CM | POA: Diagnosis not present

## 2017-03-04 DIAGNOSIS — R040 Epistaxis: Secondary | ICD-10-CM | POA: Diagnosis not present

## 2017-03-04 DIAGNOSIS — M1990 Unspecified osteoarthritis, unspecified site: Secondary | ICD-10-CM | POA: Diagnosis not present

## 2017-03-19 DIAGNOSIS — H401132 Primary open-angle glaucoma, bilateral, moderate stage: Secondary | ICD-10-CM | POA: Diagnosis not present

## 2017-06-19 DIAGNOSIS — M545 Low back pain: Secondary | ICD-10-CM | POA: Diagnosis not present

## 2017-06-19 DIAGNOSIS — R040 Epistaxis: Secondary | ICD-10-CM | POA: Diagnosis not present

## 2017-06-19 DIAGNOSIS — I10 Essential (primary) hypertension: Secondary | ICD-10-CM | POA: Diagnosis not present

## 2017-07-08 DIAGNOSIS — H401132 Primary open-angle glaucoma, bilateral, moderate stage: Secondary | ICD-10-CM | POA: Diagnosis not present

## 2017-07-10 ENCOUNTER — Other Ambulatory Visit: Payer: Self-pay | Admitting: Family Medicine

## 2017-07-10 DIAGNOSIS — M545 Low back pain: Secondary | ICD-10-CM

## 2017-07-16 ENCOUNTER — Ambulatory Visit
Admission: RE | Admit: 2017-07-16 | Discharge: 2017-07-16 | Disposition: A | Discharge status: Home / Self Care | Payer: Medicare Other | Source: Ambulatory Visit | Attending: Family Medicine | Admitting: Family Medicine

## 2017-07-16 DIAGNOSIS — M545 Low back pain: Secondary | ICD-10-CM

## 2017-07-16 DIAGNOSIS — N200 Calculus of kidney: Secondary | ICD-10-CM | POA: Diagnosis not present

## 2017-07-16 MED ORDER — IOPAMIDOL (ISOVUE-300) INJECTION 61%
100.0000 mL | Freq: Once | INTRAVENOUS | Status: AC | PRN
  Administered 2017-07-16: 100 mL via INTRAVENOUS

## 2017-08-15 DIAGNOSIS — H401132 Primary open-angle glaucoma, bilateral, moderate stage: Secondary | ICD-10-CM | POA: Diagnosis not present

## 2017-09-02 DIAGNOSIS — I10 Essential (primary) hypertension: Secondary | ICD-10-CM | POA: Diagnosis not present

## 2017-09-02 DIAGNOSIS — R7301 Impaired fasting glucose: Secondary | ICD-10-CM | POA: Diagnosis not present

## 2017-09-02 DIAGNOSIS — Z125 Encounter for screening for malignant neoplasm of prostate: Secondary | ICD-10-CM | POA: Diagnosis not present

## 2017-09-02 DIAGNOSIS — E782 Mixed hyperlipidemia: Secondary | ICD-10-CM | POA: Diagnosis not present

## 2017-09-02 DIAGNOSIS — Z1159 Encounter for screening for other viral diseases: Secondary | ICD-10-CM | POA: Diagnosis not present

## 2017-09-03 DIAGNOSIS — Z0000 Encounter for general adult medical examination without abnormal findings: Secondary | ICD-10-CM | POA: Diagnosis not present

## 2017-09-03 DIAGNOSIS — Z1389 Encounter for screening for other disorder: Secondary | ICD-10-CM | POA: Diagnosis not present

## 2017-09-03 DIAGNOSIS — R350 Frequency of micturition: Secondary | ICD-10-CM | POA: Diagnosis not present

## 2017-09-03 DIAGNOSIS — N400 Enlarged prostate without lower urinary tract symptoms: Secondary | ICD-10-CM | POA: Diagnosis not present

## 2017-09-03 DIAGNOSIS — E782 Mixed hyperlipidemia: Secondary | ICD-10-CM | POA: Diagnosis not present

## 2017-09-03 DIAGNOSIS — I10 Essential (primary) hypertension: Secondary | ICD-10-CM | POA: Diagnosis not present

## 2017-09-03 DIAGNOSIS — H409 Unspecified glaucoma: Secondary | ICD-10-CM | POA: Diagnosis not present

## 2017-09-03 DIAGNOSIS — Z125 Encounter for screening for malignant neoplasm of prostate: Secondary | ICD-10-CM | POA: Diagnosis not present

## 2017-09-03 DIAGNOSIS — Z86010 Personal history of colonic polyps: Secondary | ICD-10-CM | POA: Diagnosis not present

## 2017-09-03 DIAGNOSIS — R7301 Impaired fasting glucose: Secondary | ICD-10-CM | POA: Diagnosis not present

## 2017-09-15 DIAGNOSIS — J019 Acute sinusitis, unspecified: Secondary | ICD-10-CM | POA: Diagnosis not present

## 2017-09-15 DIAGNOSIS — R0989 Other specified symptoms and signs involving the circulatory and respiratory systems: Secondary | ICD-10-CM | POA: Diagnosis not present

## 2017-09-25 DIAGNOSIS — M5136 Other intervertebral disc degeneration, lumbar region: Secondary | ICD-10-CM | POA: Diagnosis not present

## 2017-09-25 DIAGNOSIS — M9903 Segmental and somatic dysfunction of lumbar region: Secondary | ICD-10-CM | POA: Diagnosis not present

## 2017-09-25 DIAGNOSIS — M461 Sacroiliitis, not elsewhere classified: Secondary | ICD-10-CM | POA: Diagnosis not present

## 2017-09-25 DIAGNOSIS — M9905 Segmental and somatic dysfunction of pelvic region: Secondary | ICD-10-CM | POA: Diagnosis not present

## 2017-09-29 DIAGNOSIS — M9903 Segmental and somatic dysfunction of lumbar region: Secondary | ICD-10-CM | POA: Diagnosis not present

## 2017-09-29 DIAGNOSIS — M461 Sacroiliitis, not elsewhere classified: Secondary | ICD-10-CM | POA: Diagnosis not present

## 2017-09-29 DIAGNOSIS — M5136 Other intervertebral disc degeneration, lumbar region: Secondary | ICD-10-CM | POA: Diagnosis not present

## 2017-09-29 DIAGNOSIS — M9905 Segmental and somatic dysfunction of pelvic region: Secondary | ICD-10-CM | POA: Diagnosis not present

## 2017-09-30 DIAGNOSIS — M9905 Segmental and somatic dysfunction of pelvic region: Secondary | ICD-10-CM | POA: Diagnosis not present

## 2017-09-30 DIAGNOSIS — M461 Sacroiliitis, not elsewhere classified: Secondary | ICD-10-CM | POA: Diagnosis not present

## 2017-09-30 DIAGNOSIS — M5136 Other intervertebral disc degeneration, lumbar region: Secondary | ICD-10-CM | POA: Diagnosis not present

## 2017-09-30 DIAGNOSIS — M9903 Segmental and somatic dysfunction of lumbar region: Secondary | ICD-10-CM | POA: Diagnosis not present

## 2017-10-02 DIAGNOSIS — M461 Sacroiliitis, not elsewhere classified: Secondary | ICD-10-CM | POA: Diagnosis not present

## 2017-10-02 DIAGNOSIS — M9905 Segmental and somatic dysfunction of pelvic region: Secondary | ICD-10-CM | POA: Diagnosis not present

## 2017-10-02 DIAGNOSIS — M9903 Segmental and somatic dysfunction of lumbar region: Secondary | ICD-10-CM | POA: Diagnosis not present

## 2017-10-02 DIAGNOSIS — M5136 Other intervertebral disc degeneration, lumbar region: Secondary | ICD-10-CM | POA: Diagnosis not present

## 2017-10-06 DIAGNOSIS — M5136 Other intervertebral disc degeneration, lumbar region: Secondary | ICD-10-CM | POA: Diagnosis not present

## 2017-10-06 DIAGNOSIS — M461 Sacroiliitis, not elsewhere classified: Secondary | ICD-10-CM | POA: Diagnosis not present

## 2017-10-06 DIAGNOSIS — M9905 Segmental and somatic dysfunction of pelvic region: Secondary | ICD-10-CM | POA: Diagnosis not present

## 2017-10-06 DIAGNOSIS — M9903 Segmental and somatic dysfunction of lumbar region: Secondary | ICD-10-CM | POA: Diagnosis not present

## 2017-10-07 DIAGNOSIS — M5136 Other intervertebral disc degeneration, lumbar region: Secondary | ICD-10-CM | POA: Diagnosis not present

## 2017-10-07 DIAGNOSIS — M9905 Segmental and somatic dysfunction of pelvic region: Secondary | ICD-10-CM | POA: Diagnosis not present

## 2017-10-07 DIAGNOSIS — M9903 Segmental and somatic dysfunction of lumbar region: Secondary | ICD-10-CM | POA: Diagnosis not present

## 2017-10-07 DIAGNOSIS — M461 Sacroiliitis, not elsewhere classified: Secondary | ICD-10-CM | POA: Diagnosis not present

## 2017-10-09 DIAGNOSIS — M9905 Segmental and somatic dysfunction of pelvic region: Secondary | ICD-10-CM | POA: Diagnosis not present

## 2017-10-09 DIAGNOSIS — M5136 Other intervertebral disc degeneration, lumbar region: Secondary | ICD-10-CM | POA: Diagnosis not present

## 2017-10-09 DIAGNOSIS — M461 Sacroiliitis, not elsewhere classified: Secondary | ICD-10-CM | POA: Diagnosis not present

## 2017-10-09 DIAGNOSIS — M9903 Segmental and somatic dysfunction of lumbar region: Secondary | ICD-10-CM | POA: Diagnosis not present

## 2017-10-10 DIAGNOSIS — H18413 Arcus senilis, bilateral: Secondary | ICD-10-CM | POA: Diagnosis not present

## 2017-10-10 DIAGNOSIS — H2512 Age-related nuclear cataract, left eye: Secondary | ICD-10-CM | POA: Diagnosis not present

## 2017-10-10 DIAGNOSIS — H25013 Cortical age-related cataract, bilateral: Secondary | ICD-10-CM | POA: Diagnosis not present

## 2017-10-10 DIAGNOSIS — H2513 Age-related nuclear cataract, bilateral: Secondary | ICD-10-CM | POA: Diagnosis not present

## 2017-10-10 DIAGNOSIS — H25043 Posterior subcapsular polar age-related cataract, bilateral: Secondary | ICD-10-CM | POA: Diagnosis not present

## 2017-10-14 DIAGNOSIS — M9903 Segmental and somatic dysfunction of lumbar region: Secondary | ICD-10-CM | POA: Diagnosis not present

## 2017-10-14 DIAGNOSIS — M5136 Other intervertebral disc degeneration, lumbar region: Secondary | ICD-10-CM | POA: Diagnosis not present

## 2017-10-14 DIAGNOSIS — M9905 Segmental and somatic dysfunction of pelvic region: Secondary | ICD-10-CM | POA: Diagnosis not present

## 2017-10-14 DIAGNOSIS — M461 Sacroiliitis, not elsewhere classified: Secondary | ICD-10-CM | POA: Diagnosis not present

## 2017-10-16 DIAGNOSIS — M461 Sacroiliitis, not elsewhere classified: Secondary | ICD-10-CM | POA: Diagnosis not present

## 2017-10-16 DIAGNOSIS — Z85828 Personal history of other malignant neoplasm of skin: Secondary | ICD-10-CM | POA: Diagnosis not present

## 2017-10-16 DIAGNOSIS — D225 Melanocytic nevi of trunk: Secondary | ICD-10-CM | POA: Diagnosis not present

## 2017-10-16 DIAGNOSIS — M5136 Other intervertebral disc degeneration, lumbar region: Secondary | ICD-10-CM | POA: Diagnosis not present

## 2017-10-16 DIAGNOSIS — L821 Other seborrheic keratosis: Secondary | ICD-10-CM | POA: Diagnosis not present

## 2017-10-16 DIAGNOSIS — C4441 Basal cell carcinoma of skin of scalp and neck: Secondary | ICD-10-CM | POA: Diagnosis not present

## 2017-10-16 DIAGNOSIS — M9903 Segmental and somatic dysfunction of lumbar region: Secondary | ICD-10-CM | POA: Diagnosis not present

## 2017-10-16 DIAGNOSIS — M9905 Segmental and somatic dysfunction of pelvic region: Secondary | ICD-10-CM | POA: Diagnosis not present

## 2017-10-16 DIAGNOSIS — L298 Other pruritus: Secondary | ICD-10-CM | POA: Diagnosis not present

## 2017-10-16 DIAGNOSIS — L84 Corns and callosities: Secondary | ICD-10-CM | POA: Diagnosis not present

## 2017-10-16 DIAGNOSIS — D1801 Hemangioma of skin and subcutaneous tissue: Secondary | ICD-10-CM | POA: Diagnosis not present

## 2017-10-20 DIAGNOSIS — M5136 Other intervertebral disc degeneration, lumbar region: Secondary | ICD-10-CM | POA: Diagnosis not present

## 2017-10-20 DIAGNOSIS — M461 Sacroiliitis, not elsewhere classified: Secondary | ICD-10-CM | POA: Diagnosis not present

## 2017-10-20 DIAGNOSIS — M9905 Segmental and somatic dysfunction of pelvic region: Secondary | ICD-10-CM | POA: Diagnosis not present

## 2017-10-20 DIAGNOSIS — M9903 Segmental and somatic dysfunction of lumbar region: Secondary | ICD-10-CM | POA: Diagnosis not present

## 2017-10-23 DIAGNOSIS — M9905 Segmental and somatic dysfunction of pelvic region: Secondary | ICD-10-CM | POA: Diagnosis not present

## 2017-10-23 DIAGNOSIS — M9903 Segmental and somatic dysfunction of lumbar region: Secondary | ICD-10-CM | POA: Diagnosis not present

## 2017-10-23 DIAGNOSIS — M5136 Other intervertebral disc degeneration, lumbar region: Secondary | ICD-10-CM | POA: Diagnosis not present

## 2017-10-23 DIAGNOSIS — M461 Sacroiliitis, not elsewhere classified: Secondary | ICD-10-CM | POA: Diagnosis not present

## 2017-10-29 DIAGNOSIS — M9903 Segmental and somatic dysfunction of lumbar region: Secondary | ICD-10-CM | POA: Diagnosis not present

## 2017-10-29 DIAGNOSIS — M461 Sacroiliitis, not elsewhere classified: Secondary | ICD-10-CM | POA: Diagnosis not present

## 2017-10-29 DIAGNOSIS — M5136 Other intervertebral disc degeneration, lumbar region: Secondary | ICD-10-CM | POA: Diagnosis not present

## 2017-10-29 DIAGNOSIS — M9905 Segmental and somatic dysfunction of pelvic region: Secondary | ICD-10-CM | POA: Diagnosis not present

## 2017-10-30 DIAGNOSIS — C4441 Basal cell carcinoma of skin of scalp and neck: Secondary | ICD-10-CM | POA: Diagnosis not present

## 2017-11-05 DIAGNOSIS — M461 Sacroiliitis, not elsewhere classified: Secondary | ICD-10-CM | POA: Diagnosis not present

## 2017-11-05 DIAGNOSIS — M9903 Segmental and somatic dysfunction of lumbar region: Secondary | ICD-10-CM | POA: Diagnosis not present

## 2017-11-05 DIAGNOSIS — M5136 Other intervertebral disc degeneration, lumbar region: Secondary | ICD-10-CM | POA: Diagnosis not present

## 2017-11-05 DIAGNOSIS — M9905 Segmental and somatic dysfunction of pelvic region: Secondary | ICD-10-CM | POA: Diagnosis not present

## 2017-11-11 DIAGNOSIS — M9903 Segmental and somatic dysfunction of lumbar region: Secondary | ICD-10-CM | POA: Diagnosis not present

## 2017-11-11 DIAGNOSIS — M9905 Segmental and somatic dysfunction of pelvic region: Secondary | ICD-10-CM | POA: Diagnosis not present

## 2017-11-11 DIAGNOSIS — M461 Sacroiliitis, not elsewhere classified: Secondary | ICD-10-CM | POA: Diagnosis not present

## 2017-11-11 DIAGNOSIS — M5136 Other intervertebral disc degeneration, lumbar region: Secondary | ICD-10-CM | POA: Diagnosis not present

## 2017-11-25 DIAGNOSIS — M9903 Segmental and somatic dysfunction of lumbar region: Secondary | ICD-10-CM | POA: Diagnosis not present

## 2017-11-25 DIAGNOSIS — M5136 Other intervertebral disc degeneration, lumbar region: Secondary | ICD-10-CM | POA: Diagnosis not present

## 2017-11-25 DIAGNOSIS — M461 Sacroiliitis, not elsewhere classified: Secondary | ICD-10-CM | POA: Diagnosis not present

## 2017-11-25 DIAGNOSIS — M9905 Segmental and somatic dysfunction of pelvic region: Secondary | ICD-10-CM | POA: Diagnosis not present

## 2017-12-01 DIAGNOSIS — H2513 Age-related nuclear cataract, bilateral: Secondary | ICD-10-CM | POA: Diagnosis not present

## 2017-12-01 DIAGNOSIS — H2512 Age-related nuclear cataract, left eye: Secondary | ICD-10-CM | POA: Diagnosis not present

## 2017-12-02 DIAGNOSIS — H2511 Age-related nuclear cataract, right eye: Secondary | ICD-10-CM | POA: Diagnosis not present

## 2017-12-11 DIAGNOSIS — M5136 Other intervertebral disc degeneration, lumbar region: Secondary | ICD-10-CM | POA: Diagnosis not present

## 2017-12-11 DIAGNOSIS — M9903 Segmental and somatic dysfunction of lumbar region: Secondary | ICD-10-CM | POA: Diagnosis not present

## 2017-12-11 DIAGNOSIS — M461 Sacroiliitis, not elsewhere classified: Secondary | ICD-10-CM | POA: Diagnosis not present

## 2017-12-11 DIAGNOSIS — M9905 Segmental and somatic dysfunction of pelvic region: Secondary | ICD-10-CM | POA: Diagnosis not present

## 2017-12-22 DIAGNOSIS — H2511 Age-related nuclear cataract, right eye: Secondary | ICD-10-CM | POA: Diagnosis not present

## 2017-12-25 DIAGNOSIS — M9903 Segmental and somatic dysfunction of lumbar region: Secondary | ICD-10-CM | POA: Diagnosis not present

## 2017-12-25 DIAGNOSIS — M461 Sacroiliitis, not elsewhere classified: Secondary | ICD-10-CM | POA: Diagnosis not present

## 2017-12-25 DIAGNOSIS — M5136 Other intervertebral disc degeneration, lumbar region: Secondary | ICD-10-CM | POA: Diagnosis not present

## 2017-12-25 DIAGNOSIS — M9905 Segmental and somatic dysfunction of pelvic region: Secondary | ICD-10-CM | POA: Diagnosis not present

## 2018-02-16 DIAGNOSIS — M7020 Olecranon bursitis, unspecified elbow: Secondary | ICD-10-CM | POA: Diagnosis not present

## 2018-03-13 DIAGNOSIS — Z23 Encounter for immunization: Secondary | ICD-10-CM | POA: Diagnosis not present

## 2018-03-13 DIAGNOSIS — H409 Unspecified glaucoma: Secondary | ICD-10-CM | POA: Diagnosis not present

## 2018-03-13 DIAGNOSIS — G8929 Other chronic pain: Secondary | ICD-10-CM | POA: Diagnosis not present

## 2018-03-13 DIAGNOSIS — I10 Essential (primary) hypertension: Secondary | ICD-10-CM | POA: Diagnosis not present

## 2018-03-13 DIAGNOSIS — R7301 Impaired fasting glucose: Secondary | ICD-10-CM | POA: Diagnosis not present

## 2018-03-13 DIAGNOSIS — E782 Mixed hyperlipidemia: Secondary | ICD-10-CM | POA: Diagnosis not present

## 2018-03-13 DIAGNOSIS — N400 Enlarged prostate without lower urinary tract symptoms: Secondary | ICD-10-CM | POA: Diagnosis not present

## 2018-04-15 DIAGNOSIS — H401132 Primary open-angle glaucoma, bilateral, moderate stage: Secondary | ICD-10-CM | POA: Diagnosis not present

## 2018-08-04 DIAGNOSIS — H524 Presbyopia: Secondary | ICD-10-CM | POA: Diagnosis not present

## 2018-08-04 DIAGNOSIS — H401132 Primary open-angle glaucoma, bilateral, moderate stage: Secondary | ICD-10-CM | POA: Diagnosis not present

## 2018-09-04 DIAGNOSIS — Z0000 Encounter for general adult medical examination without abnormal findings: Secondary | ICD-10-CM | POA: Diagnosis not present

## 2018-09-04 DIAGNOSIS — I10 Essential (primary) hypertension: Secondary | ICD-10-CM | POA: Diagnosis not present

## 2018-09-04 DIAGNOSIS — E782 Mixed hyperlipidemia: Secondary | ICD-10-CM | POA: Diagnosis not present

## 2018-09-04 DIAGNOSIS — N400 Enlarged prostate without lower urinary tract symptoms: Secondary | ICD-10-CM | POA: Diagnosis not present

## 2018-09-14 DIAGNOSIS — N400 Enlarged prostate without lower urinary tract symptoms: Secondary | ICD-10-CM | POA: Diagnosis not present

## 2018-09-14 DIAGNOSIS — H409 Unspecified glaucoma: Secondary | ICD-10-CM | POA: Diagnosis not present

## 2018-09-14 DIAGNOSIS — Z0000 Encounter for general adult medical examination without abnormal findings: Secondary | ICD-10-CM | POA: Diagnosis not present

## 2018-09-14 DIAGNOSIS — E782 Mixed hyperlipidemia: Secondary | ICD-10-CM | POA: Diagnosis not present

## 2018-09-14 DIAGNOSIS — I10 Essential (primary) hypertension: Secondary | ICD-10-CM | POA: Diagnosis not present

## 2018-12-18 DIAGNOSIS — Z85828 Personal history of other malignant neoplasm of skin: Secondary | ICD-10-CM | POA: Diagnosis not present

## 2018-12-18 DIAGNOSIS — L821 Other seborrheic keratosis: Secondary | ICD-10-CM | POA: Diagnosis not present

## 2018-12-18 DIAGNOSIS — L905 Scar conditions and fibrosis of skin: Secondary | ICD-10-CM | POA: Diagnosis not present

## 2018-12-18 DIAGNOSIS — L929 Granulomatous disorder of the skin and subcutaneous tissue, unspecified: Secondary | ICD-10-CM | POA: Diagnosis not present

## 2018-12-18 DIAGNOSIS — D1801 Hemangioma of skin and subcutaneous tissue: Secondary | ICD-10-CM | POA: Diagnosis not present

## 2018-12-18 DIAGNOSIS — D485 Neoplasm of uncertain behavior of skin: Secondary | ICD-10-CM | POA: Diagnosis not present

## 2019-01-18 DIAGNOSIS — H401132 Primary open-angle glaucoma, bilateral, moderate stage: Secondary | ICD-10-CM | POA: Diagnosis not present

## 2019-03-19 DIAGNOSIS — E782 Mixed hyperlipidemia: Secondary | ICD-10-CM | POA: Diagnosis not present

## 2019-03-19 DIAGNOSIS — H409 Unspecified glaucoma: Secondary | ICD-10-CM | POA: Diagnosis not present

## 2019-03-19 DIAGNOSIS — F4320 Adjustment disorder, unspecified: Secondary | ICD-10-CM | POA: Diagnosis not present

## 2019-03-19 DIAGNOSIS — I10 Essential (primary) hypertension: Secondary | ICD-10-CM | POA: Diagnosis not present

## 2019-04-09 DIAGNOSIS — Z23 Encounter for immunization: Secondary | ICD-10-CM | POA: Diagnosis not present

## 2019-05-18 DIAGNOSIS — H401132 Primary open-angle glaucoma, bilateral, moderate stage: Secondary | ICD-10-CM | POA: Diagnosis not present

## 2019-07-25 ENCOUNTER — Ambulatory Visit: Payer: Medicare Other | Attending: Internal Medicine

## 2019-07-25 DIAGNOSIS — Z23 Encounter for immunization: Secondary | ICD-10-CM | POA: Insufficient documentation

## 2019-07-25 NOTE — Progress Notes (Signed)
   Covid-19 Vaccination Clinic  Name:  Derrick Harris    MRN: [id number] DOB: 1945-11-25  07/25/2019  [name] was observed post Covid-19 immunization for 15 minutes without incidence. He was provided with Vaccine Information Sheet and instruction to access the V-Safe system.   [name] was instructed to call 911 with any severe reactions post vaccine: Marland Kitchen Difficulty breathing  . Swelling of your face and throat  . A fast heartbeat  . A bad rash all over your body  . Dizziness and weakness    Immunizations Administered    Name Date Dose VIS Date Route   Pfizer COVID-19 Vaccine 07/25/2019  1:56 PM 0.3 mL 06/11/2019 Intramuscular   Manufacturer: [place]   Lot: [id number]   [place]: [id number]

## 2019-08-16 ENCOUNTER — Ambulatory Visit: Payer: Medicare Other | Attending: Internal Medicine

## 2019-08-16 DIAGNOSIS — Z23 Encounter for immunization: Secondary | ICD-10-CM

## 2019-08-16 NOTE — Progress Notes (Signed)
   Covid-19 Vaccination Clinic  Name:  Derrick Harris    MRN: [id number] DOB: March 27, 1946  08/16/2019  [name] was observed post Covid-19 immunization for 15 minutes without incidence. He was provided with Vaccine Information Sheet and instruction to access the V-Safe system.   [name] was instructed to call 911 with any severe reactions post vaccine: Marland Kitchen Difficulty breathing  . Swelling of your face and throat  . A fast heartbeat  . A bad rash all over your body  . Dizziness and weakness    Immunizations Administered    Name Date Dose VIS Date Route   Pfizer COVID-19 Vaccine 08/16/2019 12:35 PM 0.3 mL 06/11/2019 Intramuscular   Manufacturer: [place]   Lot: [id number]   [place]: [id number]

## 2019-08-18 ENCOUNTER — Ambulatory Visit: Payer: Medicare Other

## 2019-09-16 DIAGNOSIS — Z0000 Encounter for general adult medical examination without abnormal findings: Secondary | ICD-10-CM | POA: Diagnosis not present

## 2019-09-20 DIAGNOSIS — K625 Hemorrhage of anus and rectum: Secondary | ICD-10-CM | POA: Diagnosis not present

## 2019-09-20 DIAGNOSIS — K5731 Diverticulosis of large intestine without perforation or abscess with bleeding: Secondary | ICD-10-CM | POA: Diagnosis not present

## 2019-09-20 DIAGNOSIS — Z86010 Personal history of colonic polyps: Secondary | ICD-10-CM | POA: Diagnosis not present

## 2019-09-23 DIAGNOSIS — K635 Polyp of colon: Secondary | ICD-10-CM | POA: Diagnosis not present

## 2019-09-23 DIAGNOSIS — D120 Benign neoplasm of cecum: Secondary | ICD-10-CM | POA: Diagnosis not present

## 2019-09-23 DIAGNOSIS — K921 Melena: Secondary | ICD-10-CM | POA: Diagnosis not present

## 2019-09-23 DIAGNOSIS — K5731 Diverticulosis of large intestine without perforation or abscess with bleeding: Secondary | ICD-10-CM | POA: Diagnosis not present

## 2019-09-23 DIAGNOSIS — K625 Hemorrhage of anus and rectum: Secondary | ICD-10-CM | POA: Diagnosis not present

## 2019-09-23 DIAGNOSIS — D123 Benign neoplasm of transverse colon: Secondary | ICD-10-CM | POA: Diagnosis not present

## 2019-09-27 DIAGNOSIS — F4320 Adjustment disorder, unspecified: Secondary | ICD-10-CM | POA: Diagnosis not present

## 2019-09-27 DIAGNOSIS — I10 Essential (primary) hypertension: Secondary | ICD-10-CM | POA: Diagnosis not present

## 2019-09-27 DIAGNOSIS — E782 Mixed hyperlipidemia: Secondary | ICD-10-CM | POA: Diagnosis not present

## 2019-09-27 DIAGNOSIS — R7309 Other abnormal glucose: Secondary | ICD-10-CM | POA: Diagnosis not present

## 2019-09-28 DIAGNOSIS — K5731 Diverticulosis of large intestine without perforation or abscess with bleeding: Secondary | ICD-10-CM | POA: Diagnosis not present

## 2019-09-28 DIAGNOSIS — E782 Mixed hyperlipidemia: Secondary | ICD-10-CM | POA: Diagnosis not present

## 2019-09-28 DIAGNOSIS — I10 Essential (primary) hypertension: Secondary | ICD-10-CM | POA: Diagnosis not present

## 2019-09-28 DIAGNOSIS — F439 Reaction to severe stress, unspecified: Secondary | ICD-10-CM | POA: Diagnosis not present

## 2019-10-11 DIAGNOSIS — H524 Presbyopia: Secondary | ICD-10-CM | POA: Diagnosis not present

## 2019-10-11 DIAGNOSIS — H401132 Primary open-angle glaucoma, bilateral, moderate stage: Secondary | ICD-10-CM | POA: Diagnosis not present

## 2019-10-11 DIAGNOSIS — K5731 Diverticulosis of large intestine without perforation or abscess with bleeding: Secondary | ICD-10-CM | POA: Diagnosis not present

## 2019-10-25 DIAGNOSIS — F439 Reaction to severe stress, unspecified: Secondary | ICD-10-CM | POA: Diagnosis not present

## 2019-12-03 DIAGNOSIS — H401132 Primary open-angle glaucoma, bilateral, moderate stage: Secondary | ICD-10-CM | POA: Diagnosis not present

## 2019-12-06 DIAGNOSIS — F4320 Adjustment disorder, unspecified: Secondary | ICD-10-CM | POA: Diagnosis not present

## 2019-12-06 DIAGNOSIS — M1990 Unspecified osteoarthritis, unspecified site: Secondary | ICD-10-CM | POA: Diagnosis not present

## 2019-12-06 DIAGNOSIS — I10 Essential (primary) hypertension: Secondary | ICD-10-CM | POA: Diagnosis not present

## 2019-12-06 DIAGNOSIS — E782 Mixed hyperlipidemia: Secondary | ICD-10-CM | POA: Diagnosis not present

## 2019-12-07 DIAGNOSIS — T63481A Toxic effect of venom of other arthropod, accidental (unintentional), initial encounter: Secondary | ICD-10-CM | POA: Diagnosis not present

## 2019-12-22 DIAGNOSIS — D225 Melanocytic nevi of trunk: Secondary | ICD-10-CM | POA: Diagnosis not present

## 2019-12-22 DIAGNOSIS — L814 Other melanin hyperpigmentation: Secondary | ICD-10-CM | POA: Diagnosis not present

## 2019-12-22 DIAGNOSIS — Z85828 Personal history of other malignant neoplasm of skin: Secondary | ICD-10-CM | POA: Diagnosis not present

## 2019-12-22 DIAGNOSIS — L821 Other seborrheic keratosis: Secondary | ICD-10-CM | POA: Diagnosis not present

## 2020-01-16 IMAGING — CT CT ABD-PELV W/ CM
1 of 3 series · 13 of 32 positions shown, 18 images · IV contrast (APPLIED)
Comparison: 12/28/2005

CLINICAL DATA: Bilateral flank pain for months.

EXAM:
CT ABDOMEN AND PELVIS WITH CONTRAST
TECHNIQUE: Multidetector CT imaging of the abdomen and pelvis was performed
using the standard protocol following bolus administration of
intravenous contrast.
CONTRAST:  100mL 3DFVI7-IKK IOPAMIDOL (3DFVI7-IKK) INJECTION 61%

[Series 2: abd/pelvis w/cm · axial · 0.83mm/px · z∈[+548,+993]mm · 13 of 101 slices shown, 18 images]
[im 6/101  soft-tissue]
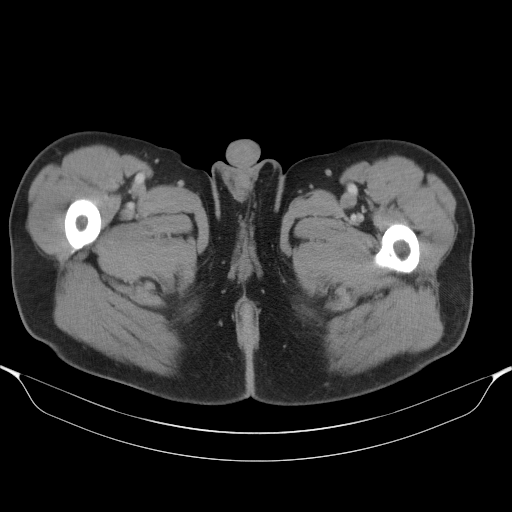
[im 6/101  bone]
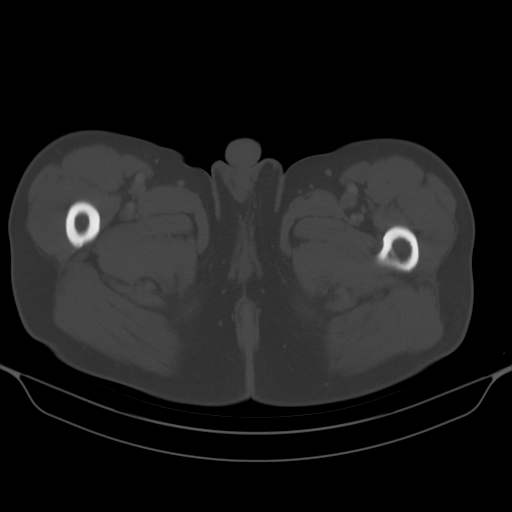
[im 16/101  soft-tissue]
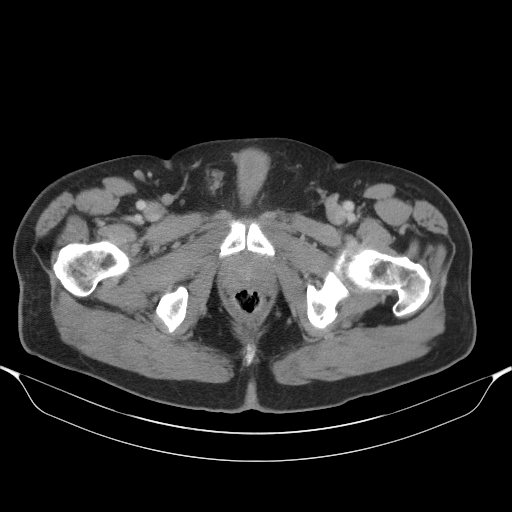
[im 22/101  soft-tissue]
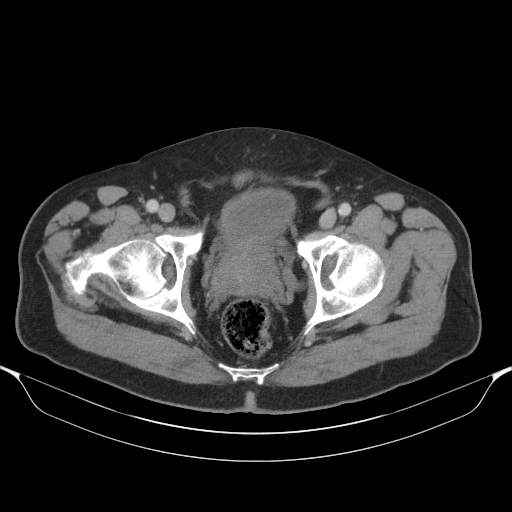
[im 32/101  soft-tissue]
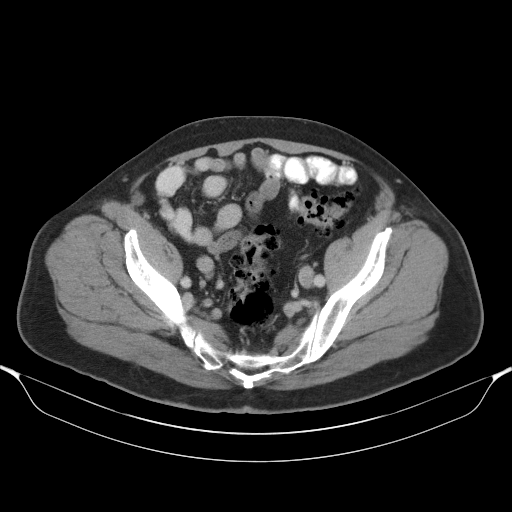
[im 37/101  soft-tissue]
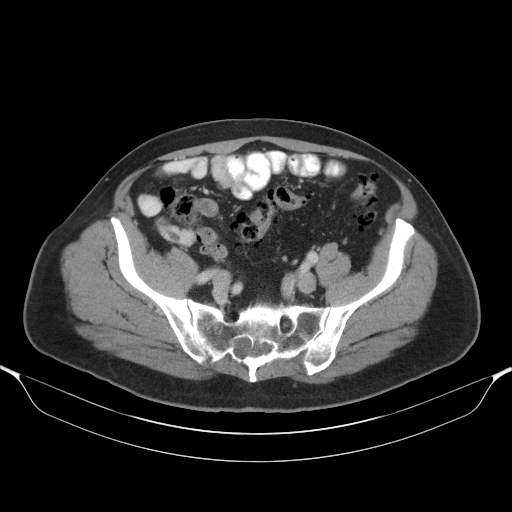
[im 48/101  soft-tissue]
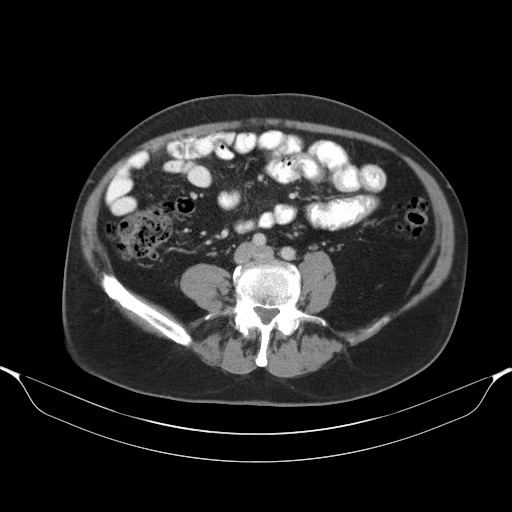
[im 53/101  soft-tissue]
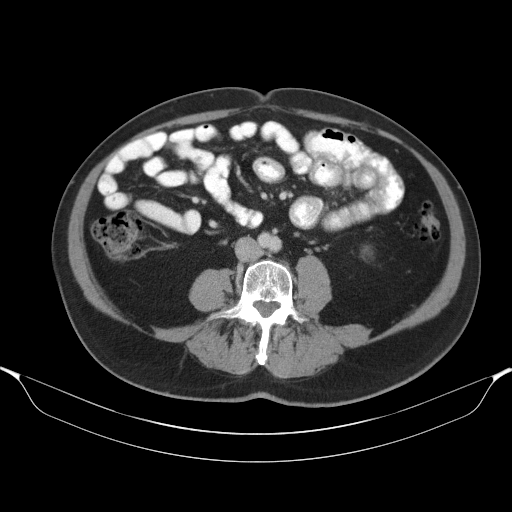
[im 64/101  soft-tissue]
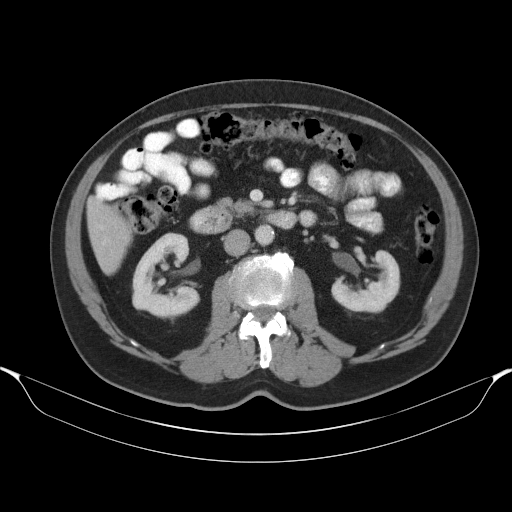
[im 69/101  soft-tissue]
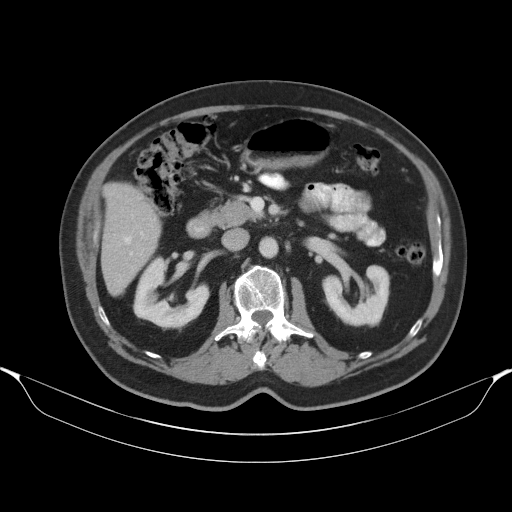
[im 69/101  bone]
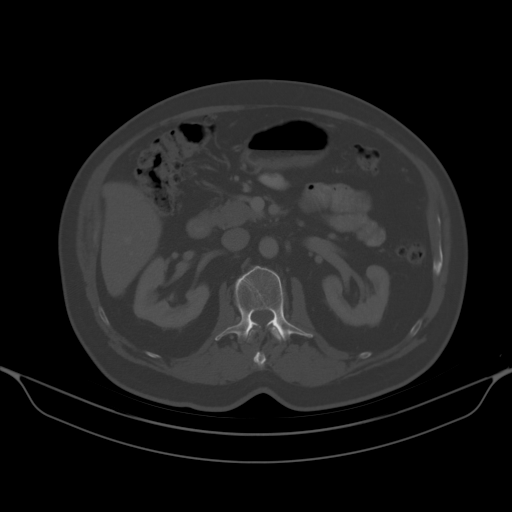
[im 79/101  soft-tissue]
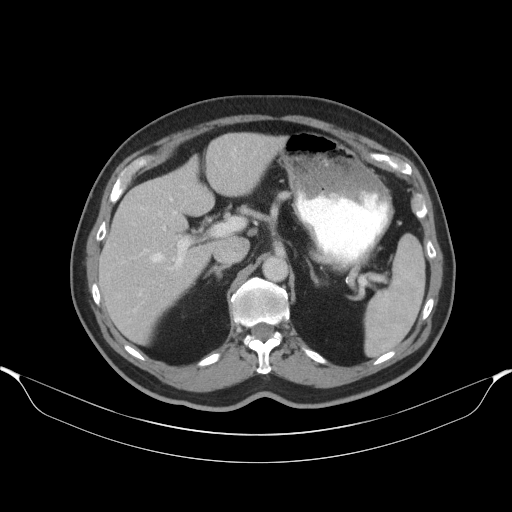
[im 79/101  lung]
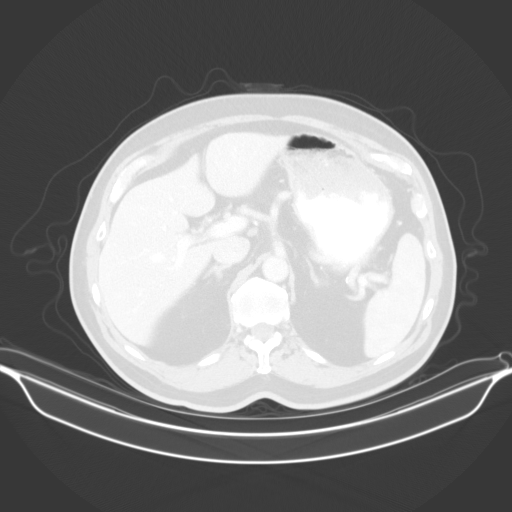
[im 85/101  soft-tissue]
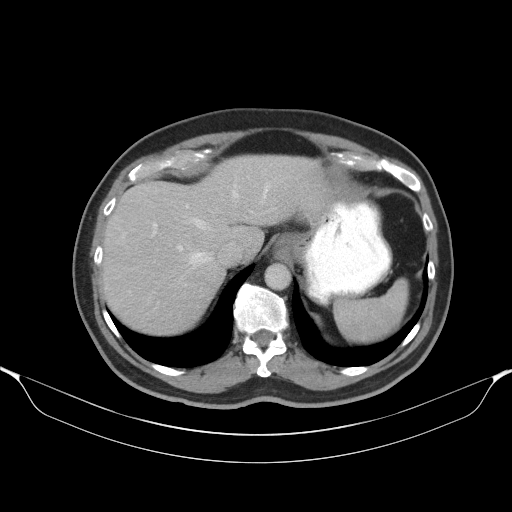
[im 85/101  lung]
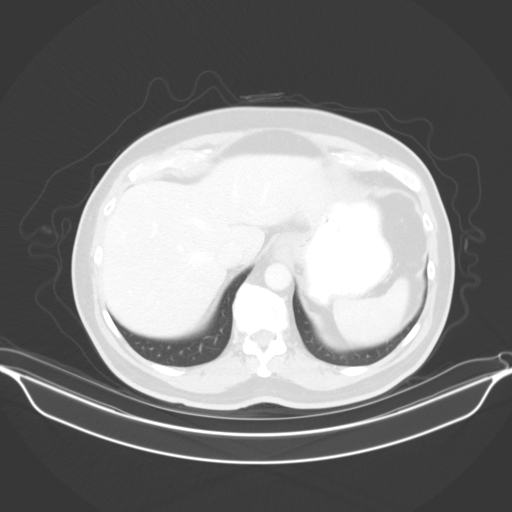
[im 90/101  lung]
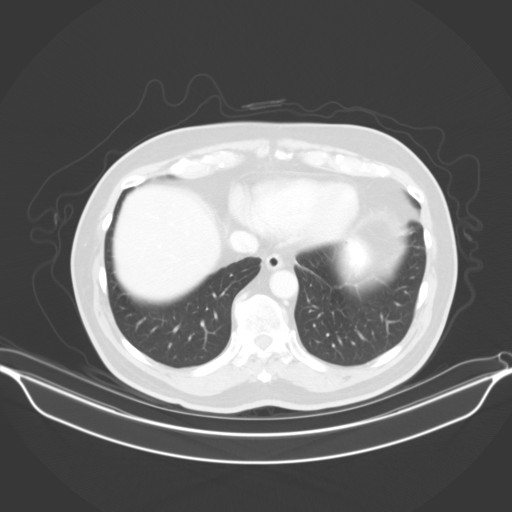
[im 95/101  soft-tissue]
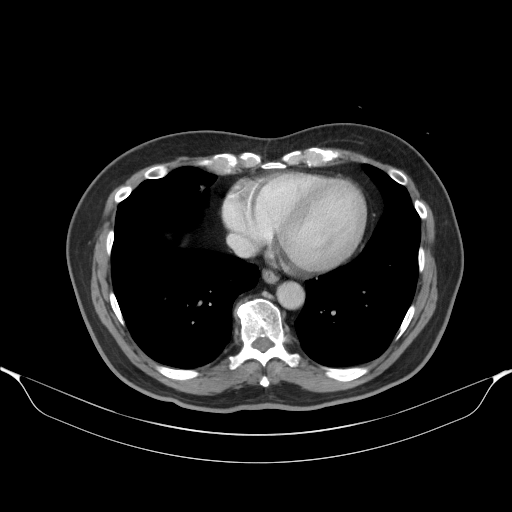
[im 95/101  lung]
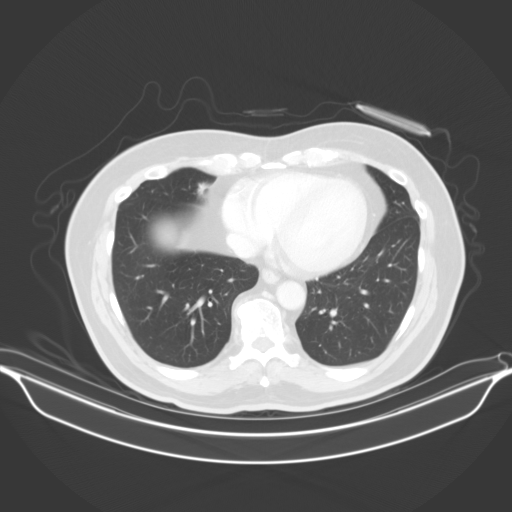

[13 of 32 positions shown; findings below may reference images not displayed]

FINDINGS: Lower chest: Unremarkable

Hepatobiliary: Dependent gallstones in the gallbladder. No
appreciable biliary dilatation. 4 by 3 mm hypodense lesion in the
dome of the right hepatic lobe, image [DATE], technically too small to
characterize.

Pancreas: Unremarkable

Spleen: Unremarkable

Adrenals/Urinary Tract: 2 mm left kidney lower pole nonobstructive
renal calculus image 52/3. 1-2 mm left kidney upper pole
nonobstructive renal calculus.

Adrenal glands normal.  Otherwise unremarkable.

Stomach/Bowel: Colonic diverticulosis most concentrated in the
sigmoid but notable throughout the colon. No current findings of
active diverticulitis. Appendix unremarkable.

Vascular/Lymphatic: Aortoiliac atherosclerotic vascular disease.

Reproductive: Prostate gland measures 6.1 by 4.4 by 5.6 cm (volume =
79 cm^3) and appears somewhat heterogeneous. Mildly prominent
periprostatic vasculature.

Absent left spermatic cord, query orchectomy or nondistended
testicle.

Other: No supplemental non-categorized findings.

Musculoskeletal: Mild lower lumbar spondylosis and degenerative disc
disease with borderline bilateral foraminal impingement at L5-S1.
Tarlov cyst eccentric to the right at the S2 level.
IMPRESSION: 1. Nonobstructive left renal calculi. No hydronephrosis or
hydroureter.
2. Cholelithiasis.
3. Prostatomegaly. Mild prominence of periprostatic vasculature of
uncertain significance.
4. Absent left spermatic cord, query orchectomy or chronic
nondistended testicle.
5. Tiny hypodense lesion in the dome of the right hepatic lobe,
technically too small to characterize although statistically likely
to be benign.
6. Mild lower lumbar spondylosis and degenerative disc disease.
Small right eccentric Tarlov cyst at S2, not changed from 5555.

## 2020-04-03 DIAGNOSIS — H401132 Primary open-angle glaucoma, bilateral, moderate stage: Secondary | ICD-10-CM | POA: Diagnosis not present

## 2020-06-20 DIAGNOSIS — F324 Major depressive disorder, single episode, in partial remission: Secondary | ICD-10-CM | POA: Diagnosis not present

## 2020-06-20 DIAGNOSIS — E782 Mixed hyperlipidemia: Secondary | ICD-10-CM | POA: Diagnosis not present

## 2020-06-20 DIAGNOSIS — M19049 Primary osteoarthritis, unspecified hand: Secondary | ICD-10-CM | POA: Diagnosis not present

## 2020-06-20 DIAGNOSIS — I10 Essential (primary) hypertension: Secondary | ICD-10-CM | POA: Diagnosis not present

## 2020-10-17 DIAGNOSIS — H524 Presbyopia: Secondary | ICD-10-CM | POA: Diagnosis not present

## 2020-10-17 DIAGNOSIS — H401132 Primary open-angle glaucoma, bilateral, moderate stage: Secondary | ICD-10-CM | POA: Diagnosis not present

## 2020-11-09 DIAGNOSIS — F324 Major depressive disorder, single episode, in partial remission: Secondary | ICD-10-CM | POA: Diagnosis not present

## 2020-11-09 DIAGNOSIS — G8929 Other chronic pain: Secondary | ICD-10-CM | POA: Diagnosis not present

## 2020-11-09 DIAGNOSIS — I10 Essential (primary) hypertension: Secondary | ICD-10-CM | POA: Diagnosis not present

## 2020-11-09 DIAGNOSIS — E782 Mixed hyperlipidemia: Secondary | ICD-10-CM | POA: Diagnosis not present

## 2020-11-28 DIAGNOSIS — H18413 Arcus senilis, bilateral: Secondary | ICD-10-CM | POA: Diagnosis not present

## 2020-11-28 DIAGNOSIS — H401131 Primary open-angle glaucoma, bilateral, mild stage: Secondary | ICD-10-CM | POA: Diagnosis not present

## 2020-11-28 DIAGNOSIS — Z961 Presence of intraocular lens: Secondary | ICD-10-CM | POA: Diagnosis not present

## 2020-11-28 DIAGNOSIS — H26492 Other secondary cataract, left eye: Secondary | ICD-10-CM | POA: Diagnosis not present

## 2020-12-21 DIAGNOSIS — M1990 Unspecified osteoarthritis, unspecified site: Secondary | ICD-10-CM | POA: Diagnosis not present

## 2020-12-21 DIAGNOSIS — E782 Mixed hyperlipidemia: Secondary | ICD-10-CM | POA: Diagnosis not present

## 2020-12-21 DIAGNOSIS — R7309 Other abnormal glucose: Secondary | ICD-10-CM | POA: Diagnosis not present

## 2020-12-21 DIAGNOSIS — Z0000 Encounter for general adult medical examination without abnormal findings: Secondary | ICD-10-CM | POA: Diagnosis not present

## 2020-12-21 DIAGNOSIS — F4320 Adjustment disorder, unspecified: Secondary | ICD-10-CM | POA: Diagnosis not present

## 2020-12-21 DIAGNOSIS — Z1389 Encounter for screening for other disorder: Secondary | ICD-10-CM | POA: Diagnosis not present

## 2020-12-21 DIAGNOSIS — I10 Essential (primary) hypertension: Secondary | ICD-10-CM | POA: Diagnosis not present

## 2020-12-22 DIAGNOSIS — D2261 Melanocytic nevi of right upper limb, including shoulder: Secondary | ICD-10-CM | POA: Diagnosis not present

## 2020-12-22 DIAGNOSIS — L821 Other seborrheic keratosis: Secondary | ICD-10-CM | POA: Diagnosis not present

## 2020-12-22 DIAGNOSIS — Z85828 Personal history of other malignant neoplasm of skin: Secondary | ICD-10-CM | POA: Diagnosis not present

## 2020-12-22 DIAGNOSIS — D1801 Hemangioma of skin and subcutaneous tissue: Secondary | ICD-10-CM | POA: Diagnosis not present

## 2020-12-25 DIAGNOSIS — E782 Mixed hyperlipidemia: Secondary | ICD-10-CM | POA: Diagnosis not present

## 2020-12-25 DIAGNOSIS — R5383 Other fatigue: Secondary | ICD-10-CM | POA: Diagnosis not present

## 2020-12-25 DIAGNOSIS — I10 Essential (primary) hypertension: Secondary | ICD-10-CM | POA: Diagnosis not present

## 2020-12-25 DIAGNOSIS — R7303 Prediabetes: Secondary | ICD-10-CM | POA: Diagnosis not present

## 2021-01-16 DIAGNOSIS — Z23 Encounter for immunization: Secondary | ICD-10-CM | POA: Diagnosis not present

## 2021-03-20 DIAGNOSIS — H401132 Primary open-angle glaucoma, bilateral, moderate stage: Secondary | ICD-10-CM | POA: Diagnosis not present

## 2021-05-07 DIAGNOSIS — F324 Major depressive disorder, single episode, in partial remission: Secondary | ICD-10-CM | POA: Diagnosis not present

## 2021-05-07 DIAGNOSIS — E782 Mixed hyperlipidemia: Secondary | ICD-10-CM | POA: Diagnosis not present

## 2021-05-07 DIAGNOSIS — I10 Essential (primary) hypertension: Secondary | ICD-10-CM | POA: Diagnosis not present

## 2021-05-07 DIAGNOSIS — G8929 Other chronic pain: Secondary | ICD-10-CM | POA: Diagnosis not present

## 2021-06-13 DIAGNOSIS — R7309 Other abnormal glucose: Secondary | ICD-10-CM | POA: Diagnosis not present

## 2021-06-18 DIAGNOSIS — I10 Essential (primary) hypertension: Secondary | ICD-10-CM | POA: Diagnosis not present

## 2021-06-18 DIAGNOSIS — E782 Mixed hyperlipidemia: Secondary | ICD-10-CM | POA: Diagnosis not present

## 2021-06-18 DIAGNOSIS — Z23 Encounter for immunization: Secondary | ICD-10-CM | POA: Diagnosis not present

## 2021-06-18 DIAGNOSIS — R7301 Impaired fasting glucose: Secondary | ICD-10-CM | POA: Diagnosis not present

## 2021-07-23 DIAGNOSIS — H401132 Primary open-angle glaucoma, bilateral, moderate stage: Secondary | ICD-10-CM | POA: Diagnosis not present

## 2021-11-20 DIAGNOSIS — H401132 Primary open-angle glaucoma, bilateral, moderate stage: Secondary | ICD-10-CM | POA: Diagnosis not present

## 2021-11-20 DIAGNOSIS — H524 Presbyopia: Secondary | ICD-10-CM | POA: Diagnosis not present

## 2021-12-25 DIAGNOSIS — L821 Other seborrheic keratosis: Secondary | ICD-10-CM | POA: Diagnosis not present

## 2021-12-25 DIAGNOSIS — E782 Mixed hyperlipidemia: Secondary | ICD-10-CM | POA: Diagnosis not present

## 2021-12-25 DIAGNOSIS — D1801 Hemangioma of skin and subcutaneous tissue: Secondary | ICD-10-CM | POA: Diagnosis not present

## 2021-12-25 DIAGNOSIS — L570 Actinic keratosis: Secondary | ICD-10-CM | POA: Diagnosis not present

## 2021-12-25 DIAGNOSIS — Z1389 Encounter for screening for other disorder: Secondary | ICD-10-CM | POA: Diagnosis not present

## 2021-12-25 DIAGNOSIS — I10 Essential (primary) hypertension: Secondary | ICD-10-CM | POA: Diagnosis not present

## 2021-12-25 DIAGNOSIS — R7301 Impaired fasting glucose: Secondary | ICD-10-CM | POA: Diagnosis not present

## 2021-12-25 DIAGNOSIS — Z0000 Encounter for general adult medical examination without abnormal findings: Secondary | ICD-10-CM | POA: Diagnosis not present

## 2021-12-25 DIAGNOSIS — L814 Other melanin hyperpigmentation: Secondary | ICD-10-CM | POA: Diagnosis not present

## 2021-12-28 DIAGNOSIS — M5451 Vertebrogenic low back pain: Secondary | ICD-10-CM | POA: Diagnosis not present

## 2021-12-28 DIAGNOSIS — R7303 Prediabetes: Secondary | ICD-10-CM | POA: Diagnosis not present

## 2021-12-28 DIAGNOSIS — M62830 Muscle spasm of back: Secondary | ICD-10-CM | POA: Diagnosis not present

## 2021-12-28 DIAGNOSIS — R350 Frequency of micturition: Secondary | ICD-10-CM | POA: Diagnosis not present

## 2022-01-14 DIAGNOSIS — R350 Frequency of micturition: Secondary | ICD-10-CM | POA: Diagnosis not present

## 2022-05-07 DIAGNOSIS — H401132 Primary open-angle glaucoma, bilateral, moderate stage: Secondary | ICD-10-CM | POA: Diagnosis not present

## 2022-07-16 DIAGNOSIS — R7303 Prediabetes: Secondary | ICD-10-CM | POA: Diagnosis not present

## 2022-07-16 DIAGNOSIS — R7309 Other abnormal glucose: Secondary | ICD-10-CM | POA: Diagnosis not present

## 2022-07-18 DIAGNOSIS — I10 Essential (primary) hypertension: Secondary | ICD-10-CM | POA: Diagnosis not present

## 2022-07-18 DIAGNOSIS — R7303 Prediabetes: Secondary | ICD-10-CM | POA: Diagnosis not present

## 2022-07-18 DIAGNOSIS — Z23 Encounter for immunization: Secondary | ICD-10-CM | POA: Diagnosis not present

## 2022-07-18 DIAGNOSIS — E782 Mixed hyperlipidemia: Secondary | ICD-10-CM | POA: Diagnosis not present

## 2022-07-18 DIAGNOSIS — F325 Major depressive disorder, single episode, in full remission: Secondary | ICD-10-CM | POA: Diagnosis not present

## 2022-10-22 DIAGNOSIS — K080 Exfoliation of teeth due to systemic causes: Secondary | ICD-10-CM | POA: Diagnosis not present

## 2022-10-30 DIAGNOSIS — K080 Exfoliation of teeth due to systemic causes: Secondary | ICD-10-CM | POA: Diagnosis not present

## 2022-11-13 DIAGNOSIS — K080 Exfoliation of teeth due to systemic causes: Secondary | ICD-10-CM | POA: Diagnosis not present

## 2022-12-27 DIAGNOSIS — L821 Other seborrheic keratosis: Secondary | ICD-10-CM | POA: Diagnosis not present

## 2022-12-27 DIAGNOSIS — Z85828 Personal history of other malignant neoplasm of skin: Secondary | ICD-10-CM | POA: Diagnosis not present

## 2022-12-27 DIAGNOSIS — D1801 Hemangioma of skin and subcutaneous tissue: Secondary | ICD-10-CM | POA: Diagnosis not present

## 2023-01-10 DIAGNOSIS — Z0000 Encounter for general adult medical examination without abnormal findings: Secondary | ICD-10-CM | POA: Diagnosis not present

## 2023-01-21 DIAGNOSIS — H524 Presbyopia: Secondary | ICD-10-CM | POA: Diagnosis not present

## 2023-01-21 DIAGNOSIS — H401132 Primary open-angle glaucoma, bilateral, moderate stage: Secondary | ICD-10-CM | POA: Diagnosis not present

## 2023-01-24 DIAGNOSIS — I10 Essential (primary) hypertension: Secondary | ICD-10-CM | POA: Diagnosis not present

## 2023-01-24 DIAGNOSIS — R7303 Prediabetes: Secondary | ICD-10-CM | POA: Diagnosis not present

## 2023-01-24 DIAGNOSIS — E782 Mixed hyperlipidemia: Secondary | ICD-10-CM | POA: Diagnosis not present

## 2023-01-29 DIAGNOSIS — Z7189 Other specified counseling: Secondary | ICD-10-CM | POA: Diagnosis not present

## 2023-01-29 DIAGNOSIS — I10 Essential (primary) hypertension: Secondary | ICD-10-CM | POA: Diagnosis not present

## 2023-01-29 DIAGNOSIS — N400 Enlarged prostate without lower urinary tract symptoms: Secondary | ICD-10-CM | POA: Diagnosis not present

## 2023-01-29 DIAGNOSIS — E782 Mixed hyperlipidemia: Secondary | ICD-10-CM | POA: Diagnosis not present

## 2023-01-29 DIAGNOSIS — F325 Major depressive disorder, single episode, in full remission: Secondary | ICD-10-CM | POA: Diagnosis not present

## 2023-03-14 DIAGNOSIS — U071 COVID-19: Secondary | ICD-10-CM | POA: Diagnosis not present

## 2023-03-26 DIAGNOSIS — U071 COVID-19: Secondary | ICD-10-CM | POA: Diagnosis not present

## 2023-05-05 DIAGNOSIS — K080 Exfoliation of teeth due to systemic causes: Secondary | ICD-10-CM | POA: Diagnosis not present

## 2023-05-07 DIAGNOSIS — H401132 Primary open-angle glaucoma, bilateral, moderate stage: Secondary | ICD-10-CM | POA: Diagnosis not present

## 2023-08-05 DIAGNOSIS — R350 Frequency of micturition: Secondary | ICD-10-CM | POA: Diagnosis not present

## 2023-08-05 DIAGNOSIS — F325 Major depressive disorder, single episode, in full remission: Secondary | ICD-10-CM | POA: Diagnosis not present

## 2023-08-05 DIAGNOSIS — R7303 Prediabetes: Secondary | ICD-10-CM | POA: Diagnosis not present

## 2023-08-05 DIAGNOSIS — I10 Essential (primary) hypertension: Secondary | ICD-10-CM | POA: Diagnosis not present

## 2023-08-05 DIAGNOSIS — E782 Mixed hyperlipidemia: Secondary | ICD-10-CM | POA: Diagnosis not present

## 2023-10-28 DIAGNOSIS — H6691 Otitis media, unspecified, right ear: Secondary | ICD-10-CM | POA: Diagnosis not present

## 2023-10-28 DIAGNOSIS — H6122 Impacted cerumen, left ear: Secondary | ICD-10-CM | POA: Diagnosis not present

## 2023-11-05 DIAGNOSIS — K080 Exfoliation of teeth due to systemic causes: Secondary | ICD-10-CM | POA: Diagnosis not present

## 2023-12-16 DIAGNOSIS — K080 Exfoliation of teeth due to systemic causes: Secondary | ICD-10-CM | POA: Diagnosis not present

## 2023-12-31 DIAGNOSIS — D1801 Hemangioma of skin and subcutaneous tissue: Secondary | ICD-10-CM | POA: Diagnosis not present

## 2023-12-31 DIAGNOSIS — L814 Other melanin hyperpigmentation: Secondary | ICD-10-CM | POA: Diagnosis not present

## 2023-12-31 DIAGNOSIS — L821 Other seborrheic keratosis: Secondary | ICD-10-CM | POA: Diagnosis not present

## 2024-01-12 DIAGNOSIS — R7303 Prediabetes: Secondary | ICD-10-CM | POA: Diagnosis not present

## 2024-01-12 DIAGNOSIS — E782 Mixed hyperlipidemia: Secondary | ICD-10-CM | POA: Diagnosis not present

## 2024-01-12 DIAGNOSIS — Z0000 Encounter for general adult medical examination without abnormal findings: Secondary | ICD-10-CM | POA: Diagnosis not present

## 2024-01-12 DIAGNOSIS — I10 Essential (primary) hypertension: Secondary | ICD-10-CM | POA: Diagnosis not present

## 2024-02-02 DIAGNOSIS — H5211 Myopia, right eye: Secondary | ICD-10-CM | POA: Diagnosis not present

## 2024-02-02 DIAGNOSIS — H401132 Primary open-angle glaucoma, bilateral, moderate stage: Secondary | ICD-10-CM | POA: Diagnosis not present

## 2024-02-02 DIAGNOSIS — H52223 Regular astigmatism, bilateral: Secondary | ICD-10-CM | POA: Diagnosis not present

## 2024-02-02 DIAGNOSIS — H35033 Hypertensive retinopathy, bilateral: Secondary | ICD-10-CM | POA: Diagnosis not present

## 2024-02-02 DIAGNOSIS — H524 Presbyopia: Secondary | ICD-10-CM | POA: Diagnosis not present

## 2024-02-02 DIAGNOSIS — H353132 Nonexudative age-related macular degeneration, bilateral, intermediate dry stage: Secondary | ICD-10-CM | POA: Diagnosis not present

## 2024-02-03 DIAGNOSIS — I10 Essential (primary) hypertension: Secondary | ICD-10-CM | POA: Diagnosis not present

## 2024-02-03 DIAGNOSIS — R350 Frequency of micturition: Secondary | ICD-10-CM | POA: Diagnosis not present

## 2024-02-03 DIAGNOSIS — E782 Mixed hyperlipidemia: Secondary | ICD-10-CM | POA: Diagnosis not present

## 2024-02-03 DIAGNOSIS — F325 Major depressive disorder, single episode, in full remission: Secondary | ICD-10-CM | POA: Diagnosis not present

## 2024-05-10 DIAGNOSIS — K080 Exfoliation of teeth due to systemic causes: Secondary | ICD-10-CM | POA: Diagnosis not present
